# Patient Record
Sex: Female | Born: 1990 | Race: White | Hispanic: No | Marital: Married | Smoking: Never smoker
Health system: Southern US, Community
[De-identification: ages and names within clinical notes are randomized; demographics above are authoritative.]

## PROBLEM LIST (undated history)

## (undated) DIAGNOSIS — G43909 Migraine, unspecified, not intractable, without status migrainosus: Secondary | ICD-10-CM

## (undated) DIAGNOSIS — R87629 Unspecified abnormal cytological findings in specimens from vagina: Secondary | ICD-10-CM

## (undated) DIAGNOSIS — J45909 Unspecified asthma, uncomplicated: Secondary | ICD-10-CM

## (undated) HISTORY — PX: WISDOM TOOTH EXTRACTION: SHX21

## (undated) HISTORY — PX: OTHER SURGICAL HISTORY: SHX169

---

## 2014-06-16 HISTORY — PX: OTHER SURGICAL HISTORY: SHX169

## 2019-06-17 NOTE — L&D Delivery Note (Signed)
Delivery Note At 10:25 AM a viable and healthy female was delivered via Vaginal, Spontaneous (Presentation: OP ).  APGAR: 9, 9; weight  pending   Placenta status: Spontaneous, Intact.  Cord: 3 vessels with the following complications: Loose nuchal cord released over the head before baby delivered.  Cord pH: NA  Anesthesia: Epidural Episiotomy: None Lacerations: Right labial laceration  Suture Repair: 3.0 vicryl rapide Est. Blood Loss (mL):  400   Mom to postpartum.  Baby to Couplet care / Skin to Skin.  Robley Fries 04/26/2020, 10:57 AM

## 2019-10-19 LAB — OB RESULTS CONSOLE RUBELLA ANTIBODY, IGM: Rubella: IMMUNE

## 2019-10-19 LAB — OB RESULTS CONSOLE RPR: RPR: NONREACTIVE

## 2019-10-19 LAB — OB RESULTS CONSOLE GC/CHLAMYDIA
Chlamydia: NEGATIVE
Gonorrhea: NEGATIVE

## 2019-10-19 LAB — OB RESULTS CONSOLE HIV ANTIBODY (ROUTINE TESTING): HIV: NONREACTIVE

## 2019-10-19 LAB — OB RESULTS CONSOLE HEPATITIS B SURFACE ANTIGEN: Hepatitis B Surface Ag: NEGATIVE

## 2019-10-20 ENCOUNTER — Inpatient Hospital Stay (HOSPITAL_COMMUNITY)
Admission: AD | Admit: 2019-10-20 | Payer: PRIVATE HEALTH INSURANCE | Source: Home / Self Care | Admitting: Obstetrics and Gynecology

## 2020-03-21 ENCOUNTER — Encounter (HOSPITAL_COMMUNITY): Payer: Self-pay | Admitting: Obstetrics and Gynecology

## 2020-03-21 ENCOUNTER — Inpatient Hospital Stay (HOSPITAL_COMMUNITY)
Admission: AD | Admit: 2020-03-21 | Discharge: 2020-03-22 | DRG: 833 | Disposition: A | Discharge status: Home / Self Care | Payer: PRIVATE HEALTH INSURANCE | Attending: Obstetrics and Gynecology | Admitting: Obstetrics and Gynecology

## 2020-03-21 ENCOUNTER — Other Ambulatory Visit: Payer: Self-pay

## 2020-03-21 ENCOUNTER — Inpatient Hospital Stay (HOSPITAL_BASED_OUTPATIENT_CLINIC_OR_DEPARTMENT_OTHER): Payer: PRIVATE HEALTH INSURANCE

## 2020-03-21 DIAGNOSIS — Z20822 Contact with and (suspected) exposure to covid-19: Secondary | ICD-10-CM | POA: Diagnosis present

## 2020-03-21 DIAGNOSIS — J45909 Unspecified asthma, uncomplicated: Secondary | ICD-10-CM | POA: Diagnosis present

## 2020-03-21 DIAGNOSIS — Z363 Encounter for antenatal screening for malformations: Secondary | ICD-10-CM | POA: Diagnosis not present

## 2020-03-21 DIAGNOSIS — O99513 Diseases of the respiratory system complicating pregnancy, third trimester: Secondary | ICD-10-CM | POA: Diagnosis present

## 2020-03-21 DIAGNOSIS — Z3A32 32 weeks gestation of pregnancy: Secondary | ICD-10-CM | POA: Diagnosis not present

## 2020-03-21 HISTORY — DX: Unspecified abnormal cytological findings in specimens from vagina: R87.629

## 2020-03-21 HISTORY — DX: Unspecified asthma, uncomplicated: J45.909

## 2020-03-21 LAB — URINALYSIS, ROUTINE W REFLEX MICROSCOPIC
Bilirubin Urine: NEGATIVE
Glucose, UA: NEGATIVE mg/dL
Hgb urine dipstick: NEGATIVE
Ketones, ur: 20 mg/dL — AB
Leukocytes,Ua: NEGATIVE
Nitrite: NEGATIVE
Protein, ur: NEGATIVE mg/dL
Specific Gravity, Urine: 1.005 (ref 1.005–1.030)
pH: 6 (ref 5.0–8.0)

## 2020-03-21 LAB — CBC
HCT: 35.9 % — ABNORMAL LOW (ref 36.0–46.0)
Hemoglobin: 12 g/dL (ref 12.0–15.0)
MCH: 29.5 pg (ref 26.0–34.0)
MCHC: 33.4 g/dL (ref 30.0–36.0)
MCV: 88.2 fL (ref 80.0–100.0)
Platelets: 224 10*3/uL (ref 150–400)
RBC: 4.07 MIL/uL (ref 3.87–5.11)
RDW: 13.6 % (ref 11.5–15.5)
WBC: 15.9 10*3/uL — ABNORMAL HIGH (ref 4.0–10.5)
nRBC: 0 % (ref 0.0–0.2)

## 2020-03-21 LAB — RESPIRATORY PANEL BY RT PCR (FLU A&B, COVID)
Influenza A by PCR: NEGATIVE
Influenza B by PCR: NEGATIVE
SARS Coronavirus 2 by RT PCR: NEGATIVE

## 2020-03-21 LAB — TYPE AND SCREEN
ABO/RH(D): O POS
Antibody Screen: NEGATIVE

## 2020-03-21 IMAGING — US US MFM OB DETAIL+14 WK
1 series · 14 of 28 positions shown · non-contrast
Comparison: none

[Series 1: us mfm ob detail+14 wk · 79 acquisitions, 14 frames shown]
[im 3/79]
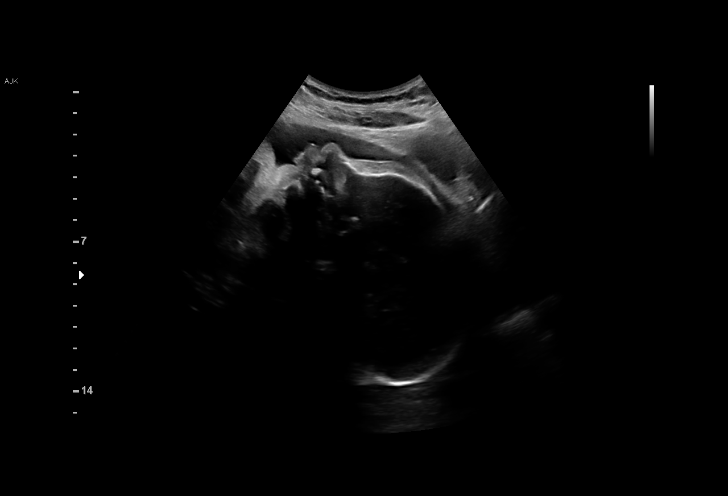
[im 9/79]
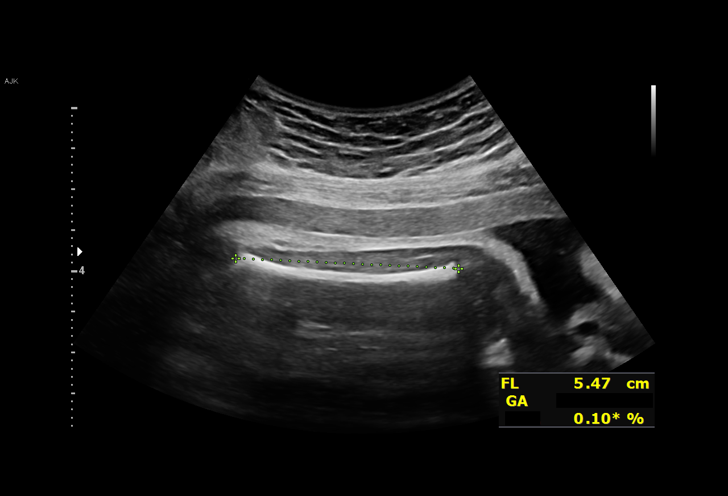
[im 15/79]
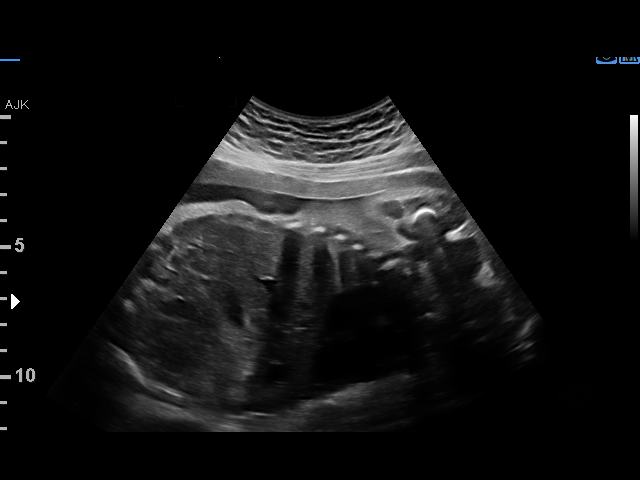
[im 21/79]
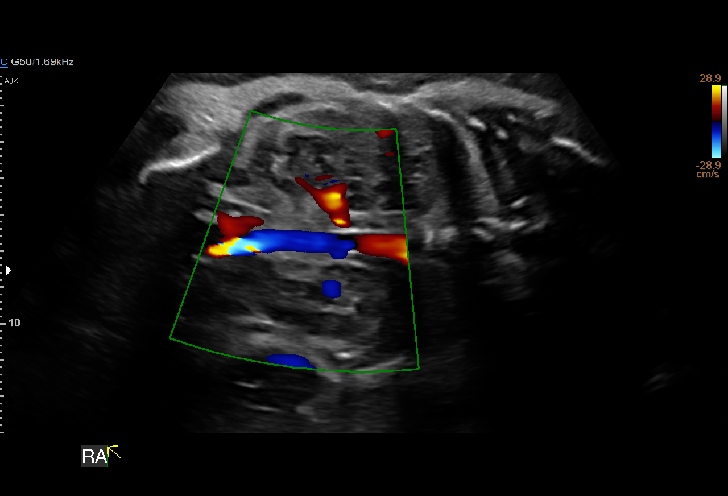
[im 27/79]
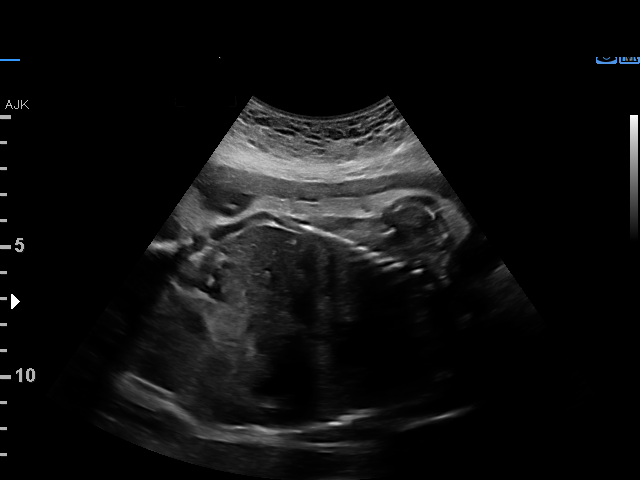
[im 32/79]
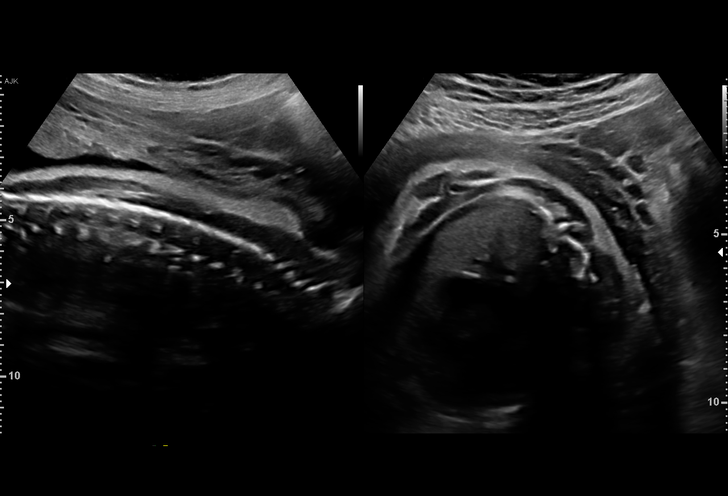
[im 38/79]
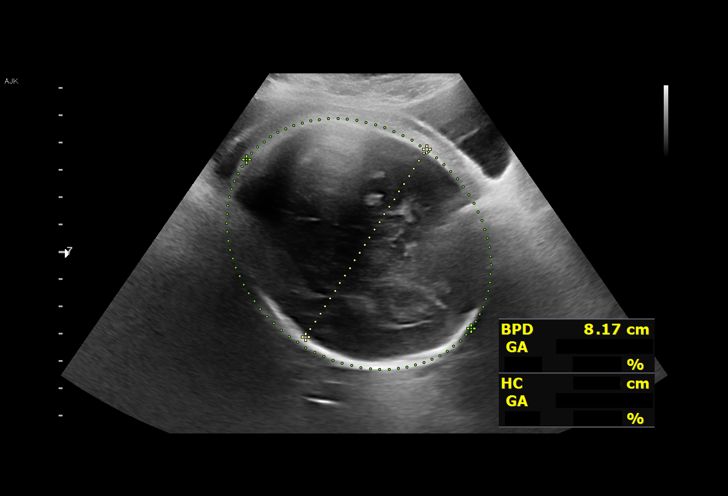
[im 44/79]
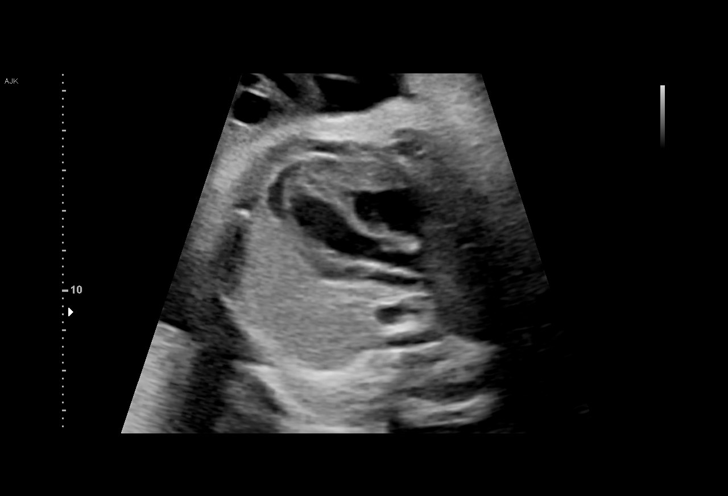
[im 50/79]
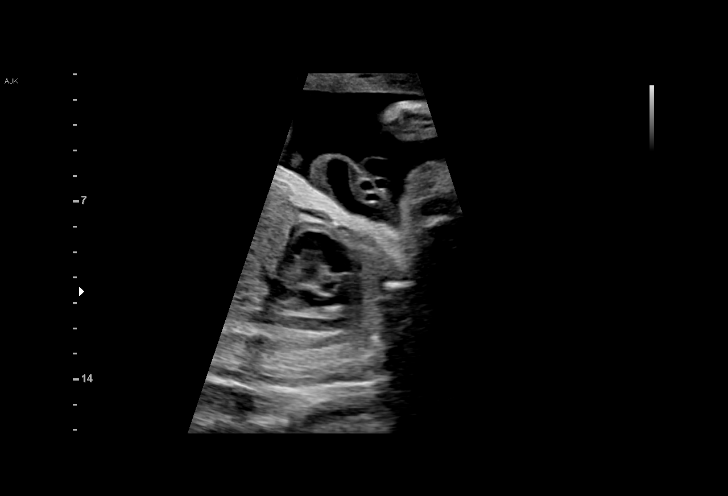
[im 55/79]
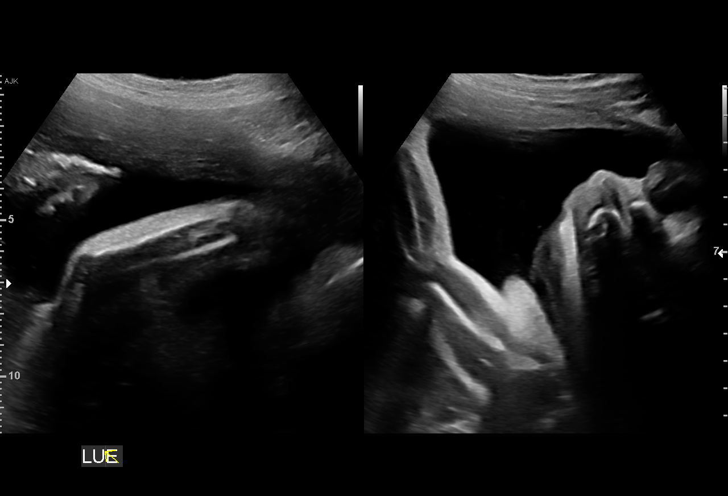
[im 61/79]
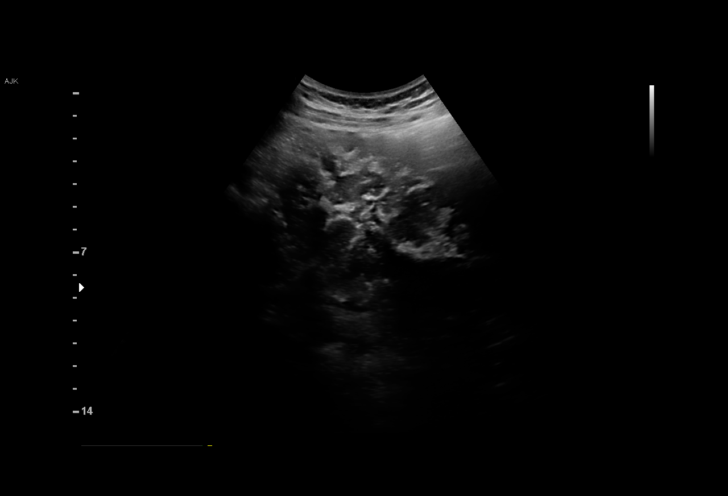
[im 67/79]
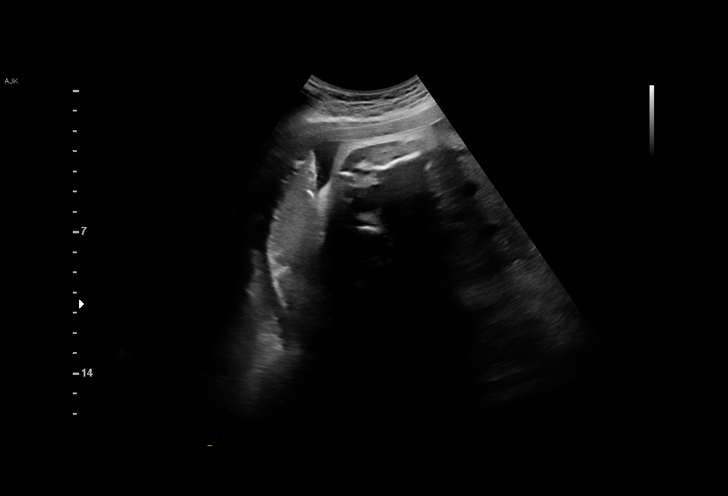
[im 73/79]
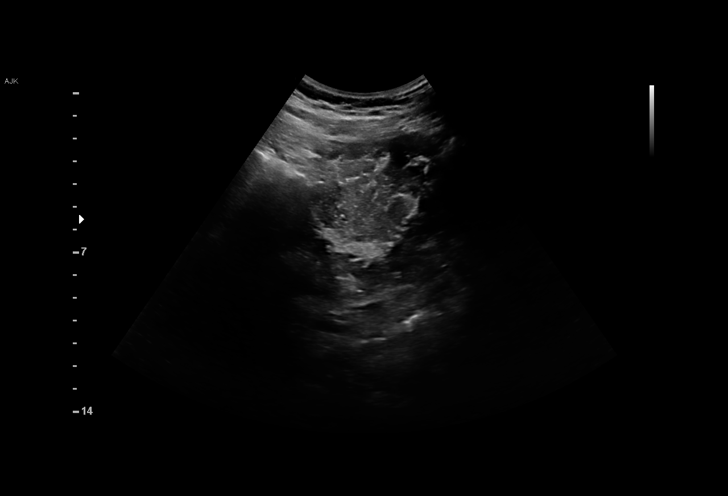
[im 79/79]
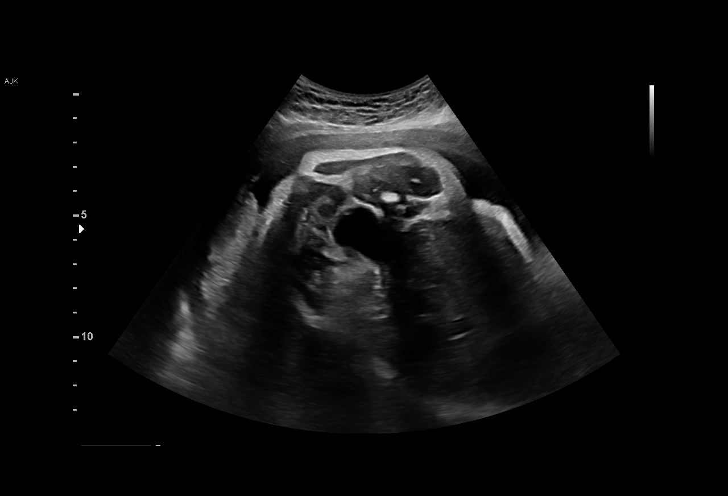

[14 of 28 positions shown; findings below may reference images not displayed]

[ST] [HOSPITAL]

Indications

 32 weeks gestation of pregnancy
 Encounter for antenatal screening for           [ST]
 malformations
 Preterm labor                                   [ST]
Fetal Evaluation

 Num Of Fetuses:          1
 Cardiac Activity:        Observed
 Presentation:            Cephalic
 Placenta:                Posterior
 P. Cord Insertion:       Visualized, central

 Amniotic Fluid
 AFI FV:      Within normal limits

 AFI Sum(cm)     %Tile       Largest Pocket(cm)
 14.1            48

 RUQ(cm)       RLQ(cm)       LUQ(cm)        LLQ(cm)

Biometry

 BPD:      81.8  mm     G. Age:  32w 6d         52  %    CI:          77.1  %    70 - 86
                                                         FL/HC:       18.2  %    19.9 -
 HC:       295   mm     G. Age:  32w 4d         15  %    HC/AC:       0.98       0.96 -
 AC:      299.5  mm     G. Age:  33w 6d         85  %    FL/BPD:      65.6  %    71 - 87
 FL:       53.7  mm     G. Age:  28w 3d        < 1  %    FL/AC:       17.9  %    20 - 24
 HUM:      47.8  mm     G. Age:  28w 0d        < 5  %

 Est. FW:    [ST]   gm     4 lb 3 oz     27  %
OB History

 Gravidity:    1
Gestational Age

 Clinical EDD:  32w 4d                                        EDD:   [DATE]
 U/S Today:     32w 0d                                        EDD:   [DATE]
 Best:          32w 4d     Det. By:  Clinical EDD             EDD:   [DATE]
Anatomy

 Cranium:               Appears normal         LVOT:                   Appears normal
 Cavum:                 Not well visualized    Aortic Arch:            Not well visualized
 Ventricles:            Not well visualized    Ductal Arch:            Appears normal
 Choroid Plexus:        Not well visualized    Diaphragm:              Appears normal
 Cerebellum:            Not well visualized    Stomach:                Appears normal, left
                                                                       sided
 Posterior Fossa:       Not well visualized    Abdomen:                Appears normal
 Nuchal Fold:           Not applicable (>20    Abdominal Wall:         Not well visualized
                        wks GA)
 Face:                  Appears normal         Cord Vessels:           Appears normal (3
                        (orbits and profile)                           vessel cord)
 Lips:                  Appears normal         Kidneys:                Appear normal
 Palate:                Not well visualized    Bladder:                Appears normal
 Thoracic:              Appears normal         Spine:                  Appears normal
 Heart:                 Appears normal         Upper Extremities:      Visualized
                        (4CH, axis, and
                        situs)
 RVOT:                  Appears normal         Lower Extremities:      Visualized

 Other:  Fetus appears to be a male. Nasal bone visualized. Technically
         difficult due to fetal position.
Impression

 Patient is admitted with diagnosis of preterm labor.

 Amniotic fluid is normal and good fetal activity is seen .Fetal
 growth is appropriate for gestational age .Fetal anatomical
 survey appears normal but is limited because of advanced
 gestational age. Cephalic presentation.
                 FOLKEDAL

## 2020-03-21 MED ORDER — DOCUSATE SODIUM 100 MG PO CAPS
100.0000 mg | ORAL_CAPSULE | Freq: Every day | ORAL | Status: DC
  Administered 2020-03-22: 100 mg via ORAL
  Filled 2020-03-21: qty 1

## 2020-03-21 MED ORDER — ACETAMINOPHEN 325 MG PO TABS
650.0000 mg | ORAL_TABLET | ORAL | Status: DC | PRN
  Administered 2020-03-21: 650 mg via ORAL
  Filled 2020-03-21: qty 2

## 2020-03-21 MED ORDER — NIFEDIPINE 10 MG PO CAPS
10.0000 mg | ORAL_CAPSULE | ORAL | Status: DC
  Administered 2020-03-21: 10 mg via ORAL
  Filled 2020-03-21: qty 1

## 2020-03-21 MED ORDER — AZITHROMYCIN 250 MG PO TABS: 1000.0000 mg | ORAL_TABLET | Freq: Once | ORAL | Status: DC

## 2020-03-21 MED ORDER — BETAMETHASONE SOD PHOS & ACET 6 (3-3) MG/ML IJ SUSP
12.0000 mg | INTRAMUSCULAR | Status: AC
  Administered 2020-03-21 – 2020-03-22 (×2): 12 mg via INTRAMUSCULAR
  Filled 2020-03-21: qty 5

## 2020-03-21 MED ORDER — ZOLPIDEM TARTRATE 5 MG PO TABS: 5.0000 mg | ORAL_TABLET | Freq: Every evening | ORAL | Status: DC | PRN

## 2020-03-21 MED ORDER — SODIUM CHLORIDE 0.9 % IV SOLN: 2.0000 g | Freq: Four times a day (QID) | INTRAVENOUS | Status: DC

## 2020-03-21 MED ORDER — BUTORPHANOL TARTRATE 1 MG/ML IJ SOLN
1.0000 mg | Freq: Once | INTRAMUSCULAR | Status: AC
  Administered 2020-03-21: 1 mg via INTRAVENOUS
  Filled 2020-03-21: qty 1

## 2020-03-21 MED ORDER — NIFEDIPINE 10 MG PO CAPS: 10.0000 mg | ORAL_CAPSULE | Freq: Four times a day (QID) | ORAL | Status: DC

## 2020-03-21 MED ORDER — NIFEDIPINE 10 MG PO CAPS
30.0000 mg | ORAL_CAPSULE | Freq: Once | ORAL | Status: AC
  Administered 2020-03-21: 30 mg via ORAL
  Filled 2020-03-21: qty 3

## 2020-03-21 MED ORDER — PRENATAL MULTIVITAMIN CH: 1.0000 | ORAL_TABLET | Freq: Every day | ORAL | Status: DC

## 2020-03-21 MED ORDER — NIFEDIPINE 10 MG PO CAPS
20.0000 mg | ORAL_CAPSULE | ORAL | Status: DC
  Administered 2020-03-22 (×3): 20 mg via ORAL
  Filled 2020-03-21 (×3): qty 2

## 2020-03-21 MED ORDER — SODIUM CHLORIDE 0.9 % IV SOLN: INTRAVENOUS | Status: DC

## 2020-03-21 MED ORDER — AMOXICILLIN 500 MG PO CAPS: 500.0000 mg | ORAL_CAPSULE | Freq: Three times a day (TID) | ORAL | Status: DC

## 2020-03-21 MED ORDER — CALCIUM CARBONATE ANTACID 500 MG PO CHEW: 2.0000 | CHEWABLE_TABLET | ORAL | Status: DC | PRN

## 2020-03-21 MED ORDER — NIFEDIPINE 10 MG PO CAPS
10.0000 mg | ORAL_CAPSULE | Freq: Once | ORAL | Status: AC
  Administered 2020-03-21: 10 mg via ORAL
  Filled 2020-03-21: qty 1

## 2020-03-21 NOTE — H&P (Addendum)
Chloe Garrett is a 29 y.o. G1P0 at [redacted]w[redacted]d gestation presents for complaint of Contractions.  Sent from office for pre-term labor as cervix was noted to be 81m dilated.  She denies lof, vm and does note +FM.  Antepartum course:  persistant vaginal/urethral irritation; asthma - stable on singulair/pulmacort [facility] at [facility] since 10 wks.  See complete pre-natal records  History OB History    Gravida  1   Para      Term      Preterm      AB      Living        SAB      TAB      Ectopic      Multiple      Live Births             Past Medical History:  Diagnosis Date  . Asthma   . Vaginal Pap smear, abnormal    Past Surgical History:  Procedure Laterality Date  . shoulder     right shoulder  . WISDOM TOOTH EXTRACTION     Family History: family history includes Diabetes in her father and mother; Hypertension in her father. Social History:  reports that she has never smoked. She has never used smokeless tobacco. She reports that she does not drink alcohol and does not use drugs.  ROS: See above otherwise negative  Prenatal labs:  ABO, Rh: --/--/O POS (10/06 1726) Antibody: NEG (10/06 1726) Rubella:  immune RPR:   neg HBsAg:   neg HIV:  neg GBS:   not yet done 1 hr Glucola: Normal Genetic screening: Normal Anatomy US: Normal  Physical Exam:   Dilation: 3 Exam by:: S. Laraya Pestka, MD Blood pressure 107/61, pulse 98, temperature 98.4 F (36.9 C), temperature source Oral, resp. rate 18, height 5\' 1"  (1.549 m), weight 70.3 kg, SpO2 98 %. A&O x 3 HEENT: Normal Lungs: CTAB CV: RRR Abdominal: Soft, Non-tender and Gravid Lower Extremities: Non-edematous, Non-tender  Pelvic Exam:      Dilatation: 3cm     Effacement: 30%     Station: -3     Presentation: Cephalic  Labs:  CBC:  Lab Results  Component Value Date   WBC 15.9 (H) 03/21/2020   RBC 4.07 03/21/2020   HGB 12.0 03/21/2020   HCT 35.9 (L) 03/21/2020   MCV 88.2 03/21/2020   MCH 29.5  03/21/2020   MCHC 33.4 03/21/2020   RDW 13.6 03/21/2020   PLT 224 03/21/2020   CMP: No results found for: NA, K, CL, CO2, GLUCOSE, BUN, CREATININE, CALCIUM, PROT, AST, ALT, ALBUMIN, ALKPHOS, BILITOT, GFRNONAA, GFRAA, ANIONGAP Urine: No results found for: COLORURINE, APPEARANCEUR, LABSPEC, PHURINE, GLUCOSEU, HGBUR, BILIRUBINUR, KETONESUR, PROTEINUR, NITRITE, LEUKOCYTESUR   Prenatal Transfer Tool  Maternal Diabetes: No Genetic Screening: Normal Maternal Ultrasounds/Referrals: Normal Fetal Ultrasounds or other Referrals:  None Maternal Substance Abuse:  No Significant Maternal Medications:  Singulair, pulmacort Significant Maternal Lab Results: None  FHT:  TOCO: q 1-2 min; palpate mild, pt rates about a 3/10  Assessment/Plan:  29 y.o. G1P0 at [redacted]w[redacted]d gestation   1. Preterm labor - begin procardia for tocolysis, will begin IVF, u/a pending but pt has not been hydrating much today, get growth u/s; cx not changed in 2 hrs (since exam at office); gbs now; ffn done in office and pending; get growth u/s now; I have reviewed the diagnosis with pt and husband and plan, questions answered; reassured that despite contractions continuing that there has not been further dilation,  contin to monitor closely 2. Prematurity - bmz x1 now, repeat in 24 hrs; consult nicu 3. gbs unknown - plan prophylaxis if active labor, no pcn allergy 4. Fetal status reassuring 5. Rh pos 6. RI  7. Asthma - stable, singulair,pulmacort  **update: FFN negative  U/s: cephalic, afi 14cm, efw 27% 1911g (4'3")   Vick Frees 03/21/2020, 9:02 PM

## 2020-03-21 NOTE — Progress Notes (Signed)
2100:  Called unit to get update on patient; nurse reported that ctx still about q 2 min, pt rating about 4; no vb, lof; pt c/o new shaking;   Strip reviewed Fht: 140s, nml variability, +accels, no decels Toco: q 2-3 min  cx checked by nursing:  3.5/70/-1  1. PTL - not much cervical change in 3 hrs, plan stadol now and monitor closely

## 2020-03-22 MED ORDER — FAMOTIDINE 20 MG PO TABS
40.0000 mg | ORAL_TABLET | Freq: Every day | ORAL | Status: DC | PRN
  Administered 2020-03-22: 40 mg via ORAL
  Filled 2020-03-22: qty 2

## 2020-03-22 MED ORDER — NIFEDIPINE 10 MG PO CAPS
10.0000 mg | ORAL_CAPSULE | ORAL | Status: DC
  Administered 2020-03-22 (×2): 10 mg via ORAL
  Filled 2020-03-22 (×2): qty 1

## 2020-03-22 MED ORDER — BUTALBITAL-APAP-CAFFEINE 50-325-40 MG PO TABS
1.0000 | ORAL_TABLET | Freq: Four times a day (QID) | ORAL | Status: DC | PRN
  Administered 2020-03-22: 1 via ORAL
  Filled 2020-03-22: qty 1

## 2020-03-22 MED ORDER — NIFEDIPINE 10 MG PO CAPS
20.0000 mg | ORAL_CAPSULE | Freq: Four times a day (QID) | ORAL | 1 refills | Status: DC
Start: 2020-03-22 — End: 2020-04-26

## 2020-03-22 NOTE — Discharge Summary (Signed)
Patient ID: Chloe Garrett MRN: [id number] DOB/AGE: Dec 15, 1990 29 y.o.  Admit date: 03/21/2020 Discharge date: March 22, 2020  Admission Diagnoses: Preterm labor [O60.00]  Discharge Diagnoses: Preterm labor [O60.00]  Arrested preterm labor         Discharged Condition: stable  Hospital Course: Patient admitted with preterm contractions and cervical dilation 3 cm, 20% effaced.  Patient was started on Procardia and dose and interval were changed throughout her stay until she achieved relief from her symptomatic contractions.  At time of discharge patient was taking Procardia 10 mg every 3 hours.  Patient denies headache.  Patient reports some back pain from the bed but no contractions and was not feeling the same as she was feeling prior to admission.  Patient reports no leakage of fluid, no vaginal bleeding and good fetal movement.  Repeat cervical exam done prior to discharge with no significant change from her exam by me in the office yesterday.  Cervical exam now 3 cm, 50% effaced, medium consistency, vertex -3, mid position.  Option to spend another night in the hospital or go home after her second betamethasone discussed with patient.  Patient reports she can be compliant with home bedrest and understands the importance of taking the Procardia at home.  Decision was made to send patient home follow-up with new contractions and if symptoms stable follow-up in the office in 1 week.  Consults: None and MFM  Treatments: IV hydration, bedrest, tocolytics, betamethasone  Disposition: home   Allergies as of 03/22/2020      Reactions   Codeine Nausea And Vomiting   Imitrex [sumatriptan] Itching   Latex Itching      Medication List    TAKE these medications   budesonide 1 MG/2ML nebulizer solution Commonly known as: PULMICORT Take 1 mg by nebulization daily.   cetirizine 10 MG tablet Commonly known as: ZYRTEC Take 10 mg by mouth daily.   docusate sodium 100 MG capsule Commonly known  as: COLACE Take 100 mg by mouth 2 (two) times daily.   montelukast 10 MG tablet Commonly known as: SINGULAIR Take 10 mg by mouth at bedtime.   NIFEdipine 10 MG capsule Commonly known as: PROCARDIA Take 2 capsules (20 mg total) by mouth every 6 (six) hours.   polyethylene glycol 17 g packet Commonly known as: MIRALAX / GLYCOLAX Take 17 g by mouth daily.        Signed: Lendon Colonel, MD MD 03/22/2020, 4:17 PM

## 2020-03-22 NOTE — Lactation Note (Signed)
Lactation Consultation Note  Patient Name: Larsen Zettel DEYCX'K Date: 03/22/2020   This is moms first baby.  Mom being d/c today. Mom reports baby boy Joelene Millin will hopefully not come for 3 more weeks.  Mom reports no one in her family has been successful at breastfeeding.   Discussed initiating pumping within 6 hours of d/c.  Ideally within the first hour.   Mom reports she just got her personal use breastpump ordered.  Mom reports she ordered a spectra pump.   Discussed pumping 8-12 times day for 15 minutes.   Discussed adding massage and hand expression to using DEBP. Urged mom to watch [employer].  Urged mom to call lactation as needed with questions/concersn.   Maternal Data    Feeding    LATCH Score                   Interventions    Lactation Tools Discussed/Used     Consult Status      Jaceyon Strole Michaelle Copas 03/22/2020, 5:49 PM

## 2020-03-22 NOTE — Progress Notes (Signed)
Pt c/o headache, worsening overnight - feels d/t not eating much and tension h/a; feeling ctx about q 2-10; states that infrequent and mild after procardia, but that an hr before next dose is due feels them about q 2-35min, breathes through them (also states that she has strong period cramps and breathes through these as well); no vb/lof Didn't really sleep last night, didn't ask for ambien; hard for her brain to shut down  Temp:  [98 F (36.7 C)-98.4 F (36.9 C)] 98 F (36.7 C) (10/07 0807) Pulse Rate:  [95-108] 103 (10/07 0807) Resp:  [16-18] 18 (10/07 0807) BP: (107-128)/(61-81) 115/73 (10/07 0807) SpO2:  [97 %-99 %] 99 % (10/07 0554) Weight:  [70.3 kg] 70.3 kg (10/06 1629)  A&ox3 nml respirations Abd: soft, nt, gravid LE: noe dema  FHT: 130s, nml variability, +accels, no decels, occ variability TOCO: irregular; spaces out to q 10 min, then periods about q 4  A/P: iup at 32.5wga 1. PTL - stable currently with procardia 20mg  po q4, will decrease dose and increase frequency to q 3 to see if this helps better; reviewed again importance of tocolytic to help get steroids on board, questions answered; ok for regular diet 2. Prematurity - 2nd bmz due 1600 today; nicu consult pending 3. Fetal status reassuring; cephalic, 4'3" (1911g), afi wnl 4. gbs pending 5. Tension h/a - fioricet prn 6. Asthma-singular/pulmacort

## 2020-03-23 LAB — CULTURE, BETA STREP (GROUP B ONLY)

## 2020-04-07 ENCOUNTER — Inpatient Hospital Stay (HOSPITAL_COMMUNITY)
Admission: AD | Admit: 2020-04-07 | Discharge: 2020-04-08 | Disposition: A | Discharge status: Home / Self Care | Payer: PRIVATE HEALTH INSURANCE | Attending: Obstetrics | Admitting: Obstetrics

## 2020-04-07 ENCOUNTER — Encounter (HOSPITAL_COMMUNITY): Payer: Self-pay | Admitting: Obstetrics

## 2020-04-07 ENCOUNTER — Other Ambulatory Visit: Payer: Self-pay

## 2020-04-07 DIAGNOSIS — O4703 False labor before 37 completed weeks of gestation, third trimester: Secondary | ICD-10-CM | POA: Insufficient documentation

## 2020-04-07 DIAGNOSIS — Z3A35 35 weeks gestation of pregnancy: Secondary | ICD-10-CM | POA: Insufficient documentation

## 2020-04-07 NOTE — MAU Note (Signed)
Patient reports contractions that were every 2 mins, starting to spread out though.  Denies LOF/VB.  Endorses + FM.  States she is on procardia for prophylaxis of PTL (last dose at 2000).  Last VE on Tueseday- 4/90%.

## 2020-04-08 DIAGNOSIS — Z3A35 35 weeks gestation of pregnancy: Secondary | ICD-10-CM

## 2020-04-08 DIAGNOSIS — O4703 False labor before 37 completed weeks of gestation, third trimester: Secondary | ICD-10-CM

## 2020-04-08 LAB — URINALYSIS, ROUTINE W REFLEX MICROSCOPIC
Bilirubin Urine: NEGATIVE
Glucose, UA: 150 mg/dL — AB
Hgb urine dipstick: NEGATIVE
Ketones, ur: 5 mg/dL — AB
Nitrite: NEGATIVE
Protein, ur: NEGATIVE mg/dL
Specific Gravity, Urine: 1.01 (ref 1.005–1.030)
pH: 5 (ref 5.0–8.0)

## 2020-04-08 MED ORDER — PROMETHAZINE HCL 25 MG/ML IJ SOLN
25.0000 mg | Freq: Once | INTRAMUSCULAR | Status: AC
  Administered 2020-04-08: 25 mg via INTRAMUSCULAR
  Filled 2020-04-08: qty 1

## 2020-04-08 MED ORDER — BUTORPHANOL TARTRATE 1 MG/ML IJ SOLN
1.0000 mg | Freq: Once | INTRAMUSCULAR | Status: AC
  Administered 2020-04-08: 1 mg via INTRAMUSCULAR
  Filled 2020-04-08: qty 1

## 2020-04-08 NOTE — MAU Provider Note (Signed)
S: Ms. Chloe Garrett is a 29 y.o. G1P0 at [redacted]w[redacted]d  who presents to MAU today complaining contractions q 3-5 minutes throughout the day. She denies vaginal bleeding. She denies LOF. She reports normal fetal movement.  Known threatened preterm labor; did receive course of BMZ. She is taking procardia Q3 hours at home.   O: BP 124/86   Pulse (!) 106   Temp 98.6 F (37 C)   Resp 17   Wt 71.7 kg   BMI 29.85 kg/m  GENERAL: Well-developed, well-nourished female in no acute distress.  HEAD: Normocephalic, atraumatic.  CHEST: Normal effort of breathing, regular heart rate ABDOMEN: Soft, nontender, gravid  Cervical exam:  Dilation: 4 Effacement (%): 80 Station: -1 Presentation: Vertex Exam by:: Venia Carbon NP   Fetal Monitoring: Baseline: 130 bpm Variability: Moderate  Accelerations: 15x15 Decelerations: None Contractions: Q3-4  MDM:  Cervix check after 2 hours in MAU, unchanged. Discussed patient with Dr. Ernestina Penna, discussed admission for observation vs. Home. Ok to offer pain medication Stadol- confirmed with pharmacist ok to use given allergy & phenergan given IM prior to DC home. Patient and partner feel ok about going home to rest. Strict return precautions reviewed.   A: SIUP at [redacted]w[redacted]d  False labor- threatened preterm labor.   P: Discharge home in stable condition  Threatened preterm labor Rest F/u with OB next week.   Venia Carbon I, NP 04/08/2020 2:39 AM

## 2020-04-18 LAB — OB RESULTS CONSOLE GBS: GBS: NEGATIVE

## 2020-04-25 ENCOUNTER — Encounter (HOSPITAL_COMMUNITY): Payer: Self-pay | Admitting: Obstetrics & Gynecology

## 2020-04-25 ENCOUNTER — Inpatient Hospital Stay (HOSPITAL_COMMUNITY)
Admission: AD | Admit: 2020-04-25 | Discharge: 2020-04-28 | DRG: 807 | Disposition: A | Discharge status: Home / Self Care | Payer: PRIVATE HEALTH INSURANCE | Attending: Obstetrics & Gynecology | Admitting: Obstetrics & Gynecology

## 2020-04-25 DIAGNOSIS — O134 Gestational [pregnancy-induced] hypertension without significant proteinuria, complicating childbirth: Secondary | ICD-10-CM | POA: Diagnosis not present

## 2020-04-25 DIAGNOSIS — O26893 Other specified pregnancy related conditions, third trimester: Secondary | ICD-10-CM | POA: Diagnosis not present

## 2020-04-25 DIAGNOSIS — O9952 Diseases of the respiratory system complicating childbirth: Secondary | ICD-10-CM | POA: Diagnosis present

## 2020-04-25 DIAGNOSIS — O99893 Other specified diseases and conditions complicating puerperium: Secondary | ICD-10-CM | POA: Diagnosis not present

## 2020-04-25 DIAGNOSIS — O2693 Pregnancy related conditions, unspecified, third trimester: Secondary | ICD-10-CM | POA: Diagnosis not present

## 2020-04-25 DIAGNOSIS — O9902 Anemia complicating childbirth: Secondary | ICD-10-CM | POA: Diagnosis present

## 2020-04-25 DIAGNOSIS — Z3A37 37 weeks gestation of pregnancy: Secondary | ICD-10-CM

## 2020-04-25 DIAGNOSIS — Z20822 Contact with and (suspected) exposure to covid-19: Secondary | ICD-10-CM | POA: Diagnosis present

## 2020-04-25 DIAGNOSIS — O113 Pre-existing hypertension with pre-eclampsia, third trimester: Secondary | ICD-10-CM | POA: Diagnosis not present

## 2020-04-25 DIAGNOSIS — O139 Gestational [pregnancy-induced] hypertension without significant proteinuria, unspecified trimester: Secondary | ICD-10-CM | POA: Diagnosis present

## 2020-04-25 DIAGNOSIS — R519 Headache, unspecified: Secondary | ICD-10-CM | POA: Diagnosis not present

## 2020-04-25 DIAGNOSIS — J45909 Unspecified asthma, uncomplicated: Secondary | ICD-10-CM | POA: Diagnosis present

## 2020-04-25 LAB — COMPREHENSIVE METABOLIC PANEL
ALT: 18 U/L (ref 0–44)
AST: 22 U/L (ref 15–41)
Albumin: 2.7 g/dL — ABNORMAL LOW (ref 3.5–5.0)
Alkaline Phosphatase: 102 U/L (ref 38–126)
Anion gap: 11 (ref 5–15)
BUN: 6 mg/dL (ref 6–20)
CO2: 22 mmol/L (ref 22–32)
Calcium: 8.3 mg/dL — ABNORMAL LOW (ref 8.9–10.3)
Chloride: 102 mmol/L (ref 98–111)
Creatinine, Ser: 0.68 mg/dL (ref 0.44–1.00)
GFR, Estimated: >60 mL/min (ref >60)
Glucose, Bld: 120 mg/dL — ABNORMAL HIGH (ref 70–99)
Potassium: 3.1 mmol/L — ABNORMAL LOW (ref 3.5–5.1)
Sodium: 135 mmol/L (ref 135–145)
Total Bilirubin: 0.6 mg/dL (ref 0.3–1.2)
Total Protein: 6.1 g/dL — ABNORMAL LOW (ref 6.5–8.1)

## 2020-04-25 LAB — CBC
HCT: 33.1 % — ABNORMAL LOW (ref 36.0–46.0)
Hemoglobin: 11.2 g/dL — ABNORMAL LOW (ref 12.0–15.0)
MCH: 28.9 pg (ref 26.0–34.0)
MCHC: 33.8 g/dL (ref 30.0–36.0)
MCV: 85.3 fL (ref 80.0–100.0)
Platelets: 209 10*3/uL (ref 150–400)
RBC: 3.88 MIL/uL (ref 3.87–5.11)
RDW: 14.1 % (ref 11.5–15.5)
WBC: 15.1 10*3/uL — ABNORMAL HIGH (ref 4.0–10.5)
nRBC: 0 % (ref 0.0–0.2)

## 2020-04-25 LAB — URINALYSIS, ROUTINE W REFLEX MICROSCOPIC
Bacteria, UA: NONE SEEN
Bilirubin Urine: NEGATIVE
Glucose, UA: NEGATIVE mg/dL
Hgb urine dipstick: NEGATIVE
Ketones, ur: NEGATIVE mg/dL
Nitrite: NEGATIVE
Protein, ur: NEGATIVE mg/dL
Specific Gravity, Urine: 1.002 — ABNORMAL LOW (ref 1.005–1.030)
pH: 6 (ref 5.0–8.0)

## 2020-04-25 LAB — PROTEIN / CREATININE RATIO, URINE
Creatinine, Urine: 22.16 mg/dL
Total Protein, Urine: <6 mg/dL

## 2020-04-25 NOTE — MAU Note (Signed)
Ctxs since 1600. Denies LOF or VB. 4cm last sve

## 2020-04-25 NOTE — MAU Provider Note (Signed)
Chief Complaint:  Contractions   First Provider Initiated Contact with Patient 04/25/20 2153     HPI: Chloe Garrett is a 29 y.o. G1P0 at 50w4dwho presents to maternity admissions reporting painful uterine contractions.  Noted to have elevated BP on assessment so I was asked to see her.   She reports good fetal movement, denies LOF, vaginal bleeding, vaginal itching/burning, urinary symptoms, dizziness, n/v, diarrhea, constipation or fever/chills.  She denies visual changes or RUQ abdominal pain.  States has had a mild headache for 3 days.  Currently does not note a headache but had one earlier today.   04/08/20:   136/91  Hypertension This is a recurrent problem. The current episode started 1 to 4 weeks ago. Associated symptoms include headaches. Pertinent negatives include no anxiety, blurred vision, chest pain, peripheral edema or shortness of breath. There are no associated agents to hypertension. Risk factors: Pregnancy as risk for HTN. Past treatments include nothing. There are no compliance problems.     Past Medical History: Past Medical History:  Diagnosis Date  . Asthma   . Vaginal Pap smear, abnormal     Past obstetric history: OB History  Gravida Para Term Preterm AB Living  1            SAB TAB Ectopic Multiple Live Births               # Outcome Date GA Lbr Len/2nd Weight Sex Delivery Anes PTL Lv  1 Current             Past Surgical History: Past Surgical History:  Procedure Laterality Date  . shoulder     right shoulder  . WISDOM TOOTH EXTRACTION      Family History: Family History  Problem Relation Age of Onset  . Diabetes Mother   . Diabetes Father   . Hypertension Father     Social History: Social History   Tobacco Use  . Smoking status: Never Smoker  . Smokeless tobacco: Never Used  Vaping Use  . Vaping Use: Never used  Substance Use Topics  . Alcohol use: Never  . Drug use: Never    Allergies:  Allergies  Allergen Reactions  . Codeine  Nausea And Vomiting  . Imitrex [Sumatriptan] Itching  . Latex Itching    Meds:  Medications Prior to Admission  Medication Sig Dispense Refill Last Dose  . acetaminophen (TYLENOL) 500 MG tablet Take 500 mg by mouth once.     . budesonide (PULMICORT) 1 MG/2ML nebulizer solution Take 1 mg by nebulization daily.     . cetirizine (ZYRTEC) 10 MG tablet Take 10 mg by mouth daily.     Marland Kitchen docusate sodium (COLACE) 100 MG capsule Take 100 mg by mouth 2 (two) times daily.     . montelukast (SINGULAIR) 10 MG tablet Take 10 mg by mouth at bedtime.     Marland Kitchen NIFEdipine (PROCARDIA) 10 MG capsule Take 2 capsules (20 mg total) by mouth every 6 (six) hours. (Patient taking differently: Take 20 mg by mouth every 6 (six) hours. Pt instructed to take 10mg  every 3 hours-took 20mg  at 2000) 240 capsule 1   . polyethylene glycol (MIRALAX / GLYCOLAX) 17 g packet Take 17 g by mouth daily.       I have reviewed patient's Past Medical Hx, Surgical Hx, Family Hx, Social Hx, medications and allergies.   ROS:  Review of Systems  Eyes: Negative for blurred vision.  Respiratory: Negative for shortness of breath.   Cardiovascular:  Negative for chest pain.  Neurological: Positive for headaches.   Other systems negative  Physical Exam   Patient Vitals for the past 24 hrs:  BP Temp Pulse Resp Height Weight  04/25/20 2126 133/89 -- 91 -- -- --  04/25/20 2124 -- 98.7 F (37.1 C) -- 18 5\' 1"  (1.549 m) 75.3 kg   Vitals:   04/25/20 2216 04/25/20 2231 04/25/20 2246 04/25/20 2300  BP: 135/90 (!) 130/93 (!) 130/92 133/90  Pulse: 93 90 92 94  Resp:      Temp:      Weight:      Height:        Constitutional: Well-developed, well-nourished female in no acute distress.  Cardiovascular: normal rate and rhythm Respiratory: normal effort, clear to auscultation bilaterally GI: Abd soft, non-tender, gravid appropriate for gestational age.   No rebound or guarding. MS: Extremities nontender, no edema, normal ROM Neurologic:  Alert and oriented x 4. DTRs 3+ with no clonus GU: Neg CVAT.  PELVIC EXAM: Dilation: 4 Effacement (%): 80 Cervical Position: Middle Station: -1 Presentation: Vertex Exam by:: [email or web address] Flippin RN   FHT:  Baseline 135 , moderate variability, accelerations present, no decelerations Contractions: q 2 mins    Labs: Results for orders placed or performed during the hospital encounter of 04/25/20 (from the past 24 hour(s))  Urinalysis, Routine w reflex microscopic Urine, Clean Catch     Status: Abnormal   Collection Time: 04/25/20  9:30 PM  Result Value Ref Range   Color, Urine STRAW (A) YELLOW   APPearance CLEAR CLEAR   Specific Gravity, Urine 1.002 (L) 1.005 - 1.030   pH 6.0 5.0 - 8.0   Glucose, UA NEGATIVE NEGATIVE mg/dL   Hgb urine dipstick NEGATIVE NEGATIVE   Bilirubin Urine NEGATIVE NEGATIVE   Ketones, ur NEGATIVE NEGATIVE mg/dL   Protein, ur NEGATIVE NEGATIVE mg/dL   Nitrite NEGATIVE NEGATIVE   Leukocytes,Ua TRACE (A) NEGATIVE   RBC / HPF 0-5 0 - 5 RBC/hpf   WBC, UA 0-5 0 - 5 WBC/hpf   Bacteria, UA NONE SEEN NONE SEEN   Squamous Epithelial / LPF 0-5 0 - 5   Mucus PRESENT   Protein / creatinine ratio, urine     Status: None   Collection Time: 04/25/20  9:30 PM  Result Value Ref Range   Creatinine, Urine 22.16 mg/dL   Total Protein, Urine <6 mg/dL   Protein Creatinine Ratio        0.00 - 0.15 mg/mg[Cre]  CBC     Status: Abnormal   Collection Time: 04/25/20 10:15 PM  Result Value Ref Range   WBC 15.1 (H) 4.0 - 10.5 K/uL   RBC 3.88 3.87 - 5.11 MIL/uL   Hemoglobin 11.2 (L) 12.0 - 15.0 g/dL   HCT 13/10/21 (L) 36 - 46 %   MCV 85.3 80.0 - 100.0 fL   MCH 28.9 26.0 - 34.0 pg   MCHC 33.8 30.0 - 36.0 g/dL   RDW 93.9 03.0 - 09.2 %   Platelets 209 150 - 400 K/uL   nRBC 0.0 0.0 - 0.2 %  Comprehensive metabolic panel     Status: Abnormal   Collection Time: 04/25/20 10:15 PM  Result Value Ref Range   Sodium 135 135 - 145 mmol/L   Potassium 3.1 (L) 3.5 - 5.1 mmol/L   Chloride 102 98  - 111 mmol/L   CO2 22 22 - 32 mmol/L   Glucose, Bld 120 (H) 70 - 99 mg/dL   BUN 6 6 -  20 mg/dL   Creatinine, Ser 7.09 0.44 - 1.00 mg/dL   Calcium 8.3 (L) 8.9 - 10.3 mg/dL   Total Protein 6.1 (L) 6.5 - 8.1 g/dL   Albumin 2.7 (L) 3.5 - 5.0 g/dL   AST 22 15 - 41 U/L   ALT 18 0 - 44 U/L   Alkaline Phosphatase 102 38 - 126 U/L   Total Bilirubin 0.6 0.3 - 1.2 mg/dL   GFR, Estimated >62 >83 mL/min   Anion gap 11 5 - 15    --/--/O POS (10/06 1726)  Imaging:  No results found.  MAU Course/MDM: I have ordered labs and reviewed results. Labs are normal  BPs have been consistently elevated.  There was one elevated BP on 04/08/20, with a 3 day history of headaches, this may represent Gestational Hypertension or preeclampsia NST reviewed, reactive Consult Dr Juliene Pina with presentation, exam findings and test results.    Assessment: Single IUP at [redacted]w[redacted]d Gestational Hypertension, possible preeclampsia (labs normal but hx headache in past few days (not currently))  Plan: Admit to Labor and Delivery Routine orders Dr Juliene Pina to follow.  Wynelle Bourgeois CNM, MSN Certified Nurse-Midwife 04/25/2020 9:53 PM

## 2020-04-26 ENCOUNTER — Inpatient Hospital Stay (HOSPITAL_COMMUNITY): Payer: PRIVATE HEALTH INSURANCE | Admitting: Anesthesiology

## 2020-04-26 ENCOUNTER — Encounter (HOSPITAL_COMMUNITY): Payer: Self-pay | Admitting: Obstetrics & Gynecology

## 2020-04-26 ENCOUNTER — Other Ambulatory Visit: Payer: Self-pay

## 2020-04-26 DIAGNOSIS — O139 Gestational [pregnancy-induced] hypertension without significant proteinuria, unspecified trimester: Secondary | ICD-10-CM | POA: Diagnosis present

## 2020-04-26 DIAGNOSIS — R519 Headache, unspecified: Secondary | ICD-10-CM | POA: Diagnosis not present

## 2020-04-26 DIAGNOSIS — O26893 Other specified pregnancy related conditions, third trimester: Secondary | ICD-10-CM | POA: Diagnosis present

## 2020-04-26 DIAGNOSIS — O9952 Diseases of the respiratory system complicating childbirth: Secondary | ICD-10-CM | POA: Diagnosis present

## 2020-04-26 DIAGNOSIS — Z3A37 37 weeks gestation of pregnancy: Secondary | ICD-10-CM | POA: Diagnosis not present

## 2020-04-26 DIAGNOSIS — O2693 Pregnancy related conditions, unspecified, third trimester: Secondary | ICD-10-CM | POA: Diagnosis not present

## 2020-04-26 DIAGNOSIS — O134 Gestational [pregnancy-induced] hypertension without significant proteinuria, complicating childbirth: Secondary | ICD-10-CM | POA: Diagnosis present

## 2020-04-26 DIAGNOSIS — O113 Pre-existing hypertension with pre-eclampsia, third trimester: Secondary | ICD-10-CM

## 2020-04-26 DIAGNOSIS — Z20822 Contact with and (suspected) exposure to covid-19: Secondary | ICD-10-CM | POA: Diagnosis present

## 2020-04-26 DIAGNOSIS — J45909 Unspecified asthma, uncomplicated: Secondary | ICD-10-CM | POA: Diagnosis present

## 2020-04-26 DIAGNOSIS — O9902 Anemia complicating childbirth: Secondary | ICD-10-CM | POA: Diagnosis present

## 2020-04-26 DIAGNOSIS — O99893 Other specified diseases and conditions complicating puerperium: Secondary | ICD-10-CM | POA: Diagnosis not present

## 2020-04-26 LAB — TYPE AND SCREEN
ABO/RH(D): O POS
Antibody Screen: NEGATIVE

## 2020-04-26 LAB — RESPIRATORY PANEL BY RT PCR (FLU A&B, COVID)
Influenza A by PCR: NEGATIVE
Influenza B by PCR: NEGATIVE
SARS Coronavirus 2 by RT PCR: NEGATIVE

## 2020-04-26 LAB — RPR: RPR Ser Ql: NONREACTIVE

## 2020-04-26 MED ORDER — BENZOCAINE-MENTHOL 20-0.5 % EX AERO
1.0000 "application " | INHALATION_SPRAY | CUTANEOUS | Status: DC | PRN
  Administered 2020-04-26 – 2020-04-28 (×2): 1 via TOPICAL
  Filled 2020-04-26 (×2): qty 56

## 2020-04-26 MED ORDER — LACTATED RINGERS IV SOLN: 500.0000 mL | INTRAVENOUS | Status: DC | PRN

## 2020-04-26 MED ORDER — PHENYLEPHRINE 40 MCG/ML (10ML) SYRINGE FOR IV PUSH (FOR BLOOD PRESSURE SUPPORT): 80.0000 ug | PREFILLED_SYRINGE | INTRAVENOUS | Status: DC | PRN

## 2020-04-26 MED ORDER — ONDANSETRON HCL 4 MG PO TABS: 4.0000 mg | ORAL_TABLET | ORAL | Status: DC | PRN

## 2020-04-26 MED ORDER — OXYTOCIN-SODIUM CHLORIDE 30-0.9 UT/500ML-% IV SOLN
2.5000 [IU]/h | INTRAVENOUS | Status: DC
  Filled 2020-04-26: qty 500

## 2020-04-26 MED ORDER — SIMETHICONE 80 MG PO CHEW: 80.0000 mg | CHEWABLE_TABLET | ORAL | Status: DC | PRN

## 2020-04-26 MED ORDER — WITCH HAZEL-GLYCERIN EX PADS: 1.0000 "application " | MEDICATED_PAD | CUTANEOUS | Status: DC | PRN

## 2020-04-26 MED ORDER — DIPHENHYDRAMINE HCL 25 MG PO CAPS: 25.0000 mg | ORAL_CAPSULE | Freq: Four times a day (QID) | ORAL | Status: DC | PRN

## 2020-04-26 MED ORDER — EPHEDRINE 5 MG/ML INJ: 10.0000 mg | INTRAVENOUS | Status: DC | PRN

## 2020-04-26 MED ORDER — ONDANSETRON HCL 4 MG/2ML IJ SOLN
4.0000 mg | Freq: Four times a day (QID) | INTRAMUSCULAR | Status: DC | PRN
  Administered 2020-04-26 (×2): 4 mg via INTRAVENOUS
  Filled 2020-04-26 (×2): qty 2

## 2020-04-26 MED ORDER — SODIUM CHLORIDE (PF) 0.9 % IJ SOLN
INTRAMUSCULAR | Status: DC | PRN
  Administered 2020-04-26: 12 mL/h via EPIDURAL

## 2020-04-26 MED ORDER — SOD CITRATE-CITRIC ACID 500-334 MG/5ML PO SOLN: 30.0000 mL | ORAL | Status: DC | PRN

## 2020-04-26 MED ORDER — LIDOCAINE-EPINEPHRINE (PF) 2 %-1:200000 IJ SOLN
INTRAMUSCULAR | Status: DC | PRN
  Administered 2020-04-26: 5 mL via EPIDURAL

## 2020-04-26 MED ORDER — ACETAMINOPHEN 325 MG PO TABS: 650.0000 mg | ORAL_TABLET | ORAL | Status: DC | PRN

## 2020-04-26 MED ORDER — MONTELUKAST SODIUM 10 MG PO TABS
10.0000 mg | ORAL_TABLET | Freq: Every day | ORAL | Status: DC
  Administered 2020-04-26 – 2020-04-27 (×2): 10 mg via ORAL
  Filled 2020-04-26 (×2): qty 1

## 2020-04-26 MED ORDER — DIBUCAINE (PERIANAL) 1 % EX OINT: 1.0000 "application " | TOPICAL_OINTMENT | CUTANEOUS | Status: DC | PRN

## 2020-04-26 MED ORDER — TETANUS-DIPHTH-ACELL PERTUSSIS 5-2.5-18.5 LF-MCG/0.5 IM SUSY: 0.5000 mL | PREFILLED_SYRINGE | Freq: Once | INTRAMUSCULAR | Status: DC

## 2020-04-26 MED ORDER — FENTANYL-BUPIVACAINE-NACL 0.5-0.125-0.9 MG/250ML-% EP SOLN
12.0000 mL/h | EPIDURAL | Status: DC | PRN
  Filled 2020-04-26: qty 250

## 2020-04-26 MED ORDER — LIDOCAINE HCL (PF) 1 % IJ SOLN
30.0000 mL | INTRAMUSCULAR | Status: AC | PRN
  Administered 2020-04-26: 30 mL via SUBCUTANEOUS
  Filled 2020-04-26: qty 30

## 2020-04-26 MED ORDER — ACETAMINOPHEN 500 MG PO TABS
500.0000 mg | ORAL_TABLET | Freq: Once | ORAL | Status: AC
  Administered 2020-04-26: 500 mg via ORAL
  Filled 2020-04-26: qty 1

## 2020-04-26 MED ORDER — OXYTOCIN BOLUS FROM INFUSION
333.0000 mL | Freq: Once | INTRAVENOUS | Status: AC
  Administered 2020-04-26: 333 mL via INTRAVENOUS

## 2020-04-26 MED ORDER — LACTATED RINGERS IV SOLN: 500.0000 mL | Freq: Once | INTRAVENOUS | Status: DC

## 2020-04-26 MED ORDER — PRENATAL MULTIVITAMIN CH
1.0000 | ORAL_TABLET | Freq: Every day | ORAL | Status: DC
  Administered 2020-04-27 – 2020-04-28 (×2): 1 via ORAL
  Filled 2020-04-26 (×2): qty 1

## 2020-04-26 MED ORDER — POLYETHYLENE GLYCOL 3350 17 G PO PACK
17.0000 g | PACK | Freq: Every day | ORAL | Status: DC
  Administered 2020-04-27 – 2020-04-28 (×2): 17 g via ORAL
  Filled 2020-04-26 (×3): qty 1

## 2020-04-26 MED ORDER — DOCUSATE SODIUM 100 MG PO CAPS
100.0000 mg | ORAL_CAPSULE | Freq: Two times a day (BID) | ORAL | Status: DC
  Administered 2020-04-26 – 2020-04-28 (×4): 100 mg via ORAL
  Filled 2020-04-26 (×5): qty 1

## 2020-04-26 MED ORDER — BUDESONIDE 0.25 MG/2ML IN SUSP: 1.0000 mg | Freq: Every day | RESPIRATORY_TRACT | Status: DC

## 2020-04-26 MED ORDER — COCONUT OIL OIL
1.0000 "application " | TOPICAL_OIL | Status: DC | PRN
  Administered 2020-04-26: 1 via TOPICAL

## 2020-04-26 MED ORDER — LORATADINE 10 MG PO TABS
10.0000 mg | ORAL_TABLET | Freq: Every day | ORAL | Status: DC
  Administered 2020-04-26 – 2020-04-27 (×2): 10 mg via ORAL
  Filled 2020-04-26 (×2): qty 1

## 2020-04-26 MED ORDER — ONDANSETRON HCL 4 MG/2ML IJ SOLN: 4.0000 mg | INTRAMUSCULAR | Status: DC | PRN

## 2020-04-26 MED ORDER — SENNOSIDES-DOCUSATE SODIUM 8.6-50 MG PO TABS
2.0000 | ORAL_TABLET | ORAL | Status: DC
  Administered 2020-04-28: 2 via ORAL
  Filled 2020-04-26: qty 2

## 2020-04-26 MED ORDER — IBUPROFEN 600 MG PO TABS
600.0000 mg | ORAL_TABLET | Freq: Four times a day (QID) | ORAL | Status: DC
  Administered 2020-04-26 – 2020-04-28 (×9): 600 mg via ORAL
  Filled 2020-04-26 (×9): qty 1

## 2020-04-26 MED ORDER — LACTATED RINGERS IV SOLN: INTRAVENOUS | Status: DC

## 2020-04-26 MED ORDER — ZOLPIDEM TARTRATE 5 MG PO TABS: 5.0000 mg | ORAL_TABLET | Freq: Every evening | ORAL | Status: DC | PRN

## 2020-04-26 MED ORDER — DIPHENHYDRAMINE HCL 50 MG/ML IJ SOLN: 12.5000 mg | INTRAMUSCULAR | Status: DC | PRN

## 2020-04-26 NOTE — Progress Notes (Signed)
Subjective: Doing well, rectal pressure   Objective: BP 119/87 (BP Location: Right Arm)   Pulse 91   Temp 98.2 F (36.8 C) (Oral)   Resp 17   Ht 5\' 1"  (1.549 m)   Wt 75.3 kg   SpO2 96%   BMI 31.37 kg/m    FHT:  FHR: 130 bpm, variability: moderate,  accelerations:  Present,  decelerations:  Absent UC:   regular, every 3 minutes SVE:   Dilation: Lip/rim Effacement (%): 100 Station: Plus 1 Exam by:: Fulton Merry  OP and 0 stn  Assessment / Plan: Spontaneous labor, progressing normally GHTN- stable, no meds Fetal Wellbeing:  Category I Pain Control:  Epidural  Anticipated MOD: working towards  [phone] 04/26/2020, 7:52 AM

## 2020-04-26 NOTE — Lactation Note (Addendum)
This note was copied from a baby's chart. Lactation Consultation Note  Patient Name: Chloe Garrett [id number] Date: 04/26/2020 Reason for consult: Initial assessment  Mother is a P46, infant is 4hours old .  Mother was given [facility] brochure and basic teaching done.   Reviewed hand expression with mother. Observed large drops of colostrum.    She is active with [facility].  Attempt to latch infant in football position. mother has lots of colostrum. Infant licked and swallowed colostrum from mothers breast . No latch . Mother left doing STS. Lots of teaching on cue base feeding done and cluster feeding.   Mother to continue to cue base feed infant and feed at least 8-12 times or more in 24 hours and advised to allow for cluster feeding infant as needed.   Mother to continue to due STS. Mother is aware of available LC services at [facility], BFSG'S, OP Dept, and phone # for questions or concerns about breastfeeding.  Mother receptive to all teaching and plan of care.     Maternal Data Has patient been taught Hand Expression?: Yes Does the patient have breastfeeding experience prior to this delivery?: No  Feeding    LATCH Score                   Interventions Interventions: Breast feeding basics reviewed  Lactation Tools Discussed/Used WIC Program: Yes   Consult Status      Chloe Garrett 04/26/2020, 2:36 PM

## 2020-04-26 NOTE — Anesthesia Preprocedure Evaluation (Signed)
Anesthesia Evaluation  Patient identified by MRN, date of birth, ID band Patient awake    Reviewed: Allergy & Precautions, NPO status , Patient's Chart, lab work & pertinent test results  Airway Mallampati: II  TM Distance: >3 FB Neck ROM: Full    Dental no notable dental hx.    Pulmonary neg pulmonary ROS,    Pulmonary exam normal breath sounds clear to auscultation       Cardiovascular hypertension (gHTN), Normal cardiovascular exam Rhythm:Regular Rate:Normal     Neuro/Psych negative neurological ROS  negative psych ROS   GI/Hepatic negative GI ROS, Neg liver ROS,   Endo/Other  negative endocrine ROS  Renal/GU negative Renal ROS  negative genitourinary   Musculoskeletal negative musculoskeletal ROS (+)   Abdominal   Peds  Hematology negative hematology ROS (+)   Anesthesia Other Findings Active labor with gHTN  Reproductive/Obstetrics (+) Pregnancy                             Anesthesia Physical Anesthesia Plan  ASA: III  Anesthesia Plan: Epidural   Post-op Pain Management:    Induction:   PONV Risk Score and Plan: Treatment may vary due to age or medical condition  Airway Management Planned: Natural Airway  Additional Equipment:   Intra-op Plan:   Post-operative Plan:   Informed Consent: I have reviewed the patients History and Physical, chart, labs and discussed the procedure including the risks, benefits and alternatives for the proposed anesthesia with the patient or authorized representative who has indicated his/her understanding and acceptance.       Plan Discussed with: Anesthesiologist  Anesthesia Plan Comments: (Patient identified. Risks, benefits, options discussed with patient including but not limited to bleeding, infection, nerve damage, paralysis, failed block, incomplete pain control, headache, blood pressure changes, nausea, vomiting, reactions to  medication, itching, and post partum back pain. Confirmed with bedside nurse the patient's most recent platelet count. Confirmed with the patient that they are not taking any anticoagulation, have any bleeding history or any family history of bleeding disorders. Patient expressed understanding and wishes to proceed. All questions were answered. )        Anesthesia Quick Evaluation

## 2020-04-26 NOTE — Anesthesia Postprocedure Evaluation (Signed)
Anesthesia Post Note  Patient: Chloe Garrett  Procedure(s) Performed: AN AD HOC LABOR EPIDURAL     Patient location during evaluation: Mother Baby Anesthesia Type: Epidural Level of consciousness: awake and alert Pain management: pain level controlled Vital Signs Assessment: post-procedure vital signs reviewed and stable Respiratory status: spontaneous breathing, nonlabored ventilation and respiratory function stable Cardiovascular status: stable Postop Assessment: no headache, no backache and epidural receding Anesthetic complications: no   No complications documented.  Last Vitals:  Vitals:   04/26/20 1227 04/26/20 1327  BP: (!) 130/91 (!) 135/96  Pulse: 90 80  Resp: 18 18  Temp: 36.8 C 36.9 C  SpO2: 98% 100%    Last Pain:  Vitals:   04/26/20 1327  TempSrc: Oral  PainSc: 0-No pain   Pain Goal: Patients Stated Pain Goal: 0 (04/25/20 2126)                 Emmaline Kluver N

## 2020-04-26 NOTE — Anesthesia Procedure Notes (Signed)
Epidural Patient location during procedure: OB Start time: 04/26/2020 2:04 AM End time: 04/26/2020 2:14 AM  Staffing Anesthesiologist: Elmer Picker, MD Performed: anesthesiologist   Preanesthetic Checklist Completed: patient identified, IV checked, risks and benefits discussed, monitors and equipment checked, pre-op evaluation and timeout performed  Epidural Patient position: sitting Prep: DuraPrep and site prepped and draped Patient monitoring: continuous pulse ox, blood pressure, heart rate and cardiac monitor Approach: midline Location: L3-L4 Injection technique: LOR air  Needle:  Needle type: Tuohy  Needle gauge: 17 G Needle length: 9 cm Needle insertion depth: 5 cm Catheter type: closed end flexible Catheter size: 19 Gauge Catheter at skin depth: 10 cm Test dose: negative  Assessment Sensory level: T8 Events: blood not aspirated, injection not painful, no injection resistance, no paresthesia and negative IV test  Additional Notes Patient identified. Risks/Benefits/Options discussed with patient including but not limited to bleeding, infection, nerve damage, paralysis, failed block, incomplete pain control, headache, blood pressure changes, nausea, vomiting, reactions to medication both or allergic, itching and postpartum back pain. Confirmed with bedside nurse the patient's most recent platelet count. Confirmed with patient that they are not currently taking any anticoagulation, have any bleeding history or any family history of bleeding disorders. Patient expressed understanding and wished to proceed. All questions were answered. Sterile technique was used throughout the entire procedure. Please see nursing notes for vital signs. Test dose was given through epidural catheter and negative prior to continuing to dose epidural or start infusion. Warning signs of high block given to the patient including shortness of breath, tingling/numbness in hands, complete motor block,  or any concerning symptoms with instructions to call for help. Patient was given instructions on fall risk and not to get out of bed. All questions and concerns addressed with instructions to call with any issues or inadequate analgesia.  Reason for block:procedure for pain

## 2020-04-26 NOTE — H&P (Signed)
Chloe Garrett is a 29 y.o. female G1, at 37.5 wks, presenting for contractions that were getting worse this evening. In MAU she was noted to have c/o persistent headache and mildly elevated BPs with diagnosis of gestational HTN, and delivery was advised.  PNCare complicated by preterm labor at 65 wksm started Procardia and stopped at 37 wks Urethral pain  Stable Asthma  Growth sono at 28, 32, 36 wks, last at 36 wks, AGA 6'1" Vx, nl AFI Took Covid vaccines, TDAP and FLu vaccine  OB History    Gravida  1   Para      Term      Preterm      AB      Living        SAB      TAB      Ectopic      Multiple      Live Births             Past Medical History:  Diagnosis Date  . Asthma   . Vaginal Pap smear, abnormal    Past Surgical History:  Procedure Laterality Date  . shoulder     right shoulder  . WISDOM TOOTH EXTRACTION     Family History: family history includes Diabetes in her father and mother; Hypertension in her father. Social History:  reports that she has never smoked. She has never used smokeless tobacco. She reports that she does not drink alcohol and does not use drugs.     Maternal Diabetes: No Genetic Screening: Normal  NIPS, XY Maternal Ultrasounds/Referrals: Normal Fetal Ultrasounds or other Referrals:  None Maternal Substance Abuse:  No Significant Maternal Medications:  Meds include: Other: Simbicort, Pulmicort, Albuterol, Procardia for preterm labor from 32 wks  Significant Maternal Lab Results:  Group B Strep negative Other Comments:  None  Review of Systems History Dilation: 5 Effacement (%): 100 Station: -1 Exam by:: Dr. Juliene Pina Blood pressure 133/87, pulse 98, temperature 98.5 F (36.9 C), temperature source Oral, resp. rate 17, height 5\' 1"  (1.549 m), weight 75.3 kg. Exam Physical Exam  A&O x 3, no acute distress. Pleasant HEENT neg, no thyromegaly Lungs CTA bilat CV RRR, S1S2 normal Abdo soft, non tender, non acute Extr no edema/  tenderness Pelvic see above, AROM, clear fluid  FHT  140s + accels no decels mod variability- cat I Toco q 3-4 min, spontaneous   Prenatal labs: ABO, Rh: --/--/O POS (11/10 2216) Antibody: NEG (11/10 2216) Rubella: Immune (05/05 0000) RPR: Nonreactive (05/05 0000)  HBsAg: Negative (05/05 0000)  HIV: Non-reactive (05/05 0000)  GBS:   Negative NIPS low risk, XY AFP 1 nl   Assessment/Plan: 29 yo G1 at 37.5 wks, active labor, gestational HTN, mild range BPs Admit, expectant mngmt, watch BPs, labs  AROM, EFW 7 lbs.    37 04/26/2020, 1:43 AM

## 2020-04-26 NOTE — Plan of Care (Signed)
  Problem: Education: Goal: Ability to make informed decisions regarding treatment and plan of care will improve Outcome: Progressing Goal: Ability to state and carry out methods to decrease the pain will improve Outcome: Progressing   Problem: Education: Goal: Knowledge of General Education information will improve Description: Including pain rating scale, medication(s)/side effects and non-pharmacologic comfort measures Outcome: Progressing

## 2020-04-26 NOTE — Progress Notes (Addendum)
Subjective: Doing well, rectal pressure and some vag pressure, but intermittent  Objective: BP 121/77   Pulse 89   Temp 98 F (36.7 C) (Oral)   Resp 17   Ht 5\' 1"  (1.549 m)   Wt 75.3 kg   SpO2 96%   BMI 31.37 kg/m    FHT:  FHR: 130 bpm, variability: moderate,  accelerations:  Present,  decelerations:  Absent UC:   regular, every 3 minutes SVE:   Dilation: 10 Effacement (%): 100 Station: 0 Exam by:: Johnita Palleschi  LOT, 0 stn  Assessment / Plan: Spontaneous labor, progressing normally GHTN- stable, no meds Fetal Wellbeing:  Category I Pain Control:  Epidural Borderline pelvis, persistent OT/OP, continue to rotate with peanut ball Anticipated MOD: working towards  [phone] 04/26/2020, 9:02 AM

## 2020-04-27 DIAGNOSIS — R519 Headache, unspecified: Secondary | ICD-10-CM

## 2020-04-27 DIAGNOSIS — O9902 Anemia complicating childbirth: Secondary | ICD-10-CM

## 2020-04-27 LAB — CBC
HCT: 27.1 % — ABNORMAL LOW (ref 36.0–46.0)
Hemoglobin: 9.1 g/dL — ABNORMAL LOW (ref 12.0–15.0)
MCH: 29.4 pg (ref 26.0–34.0)
MCHC: 33.6 g/dL (ref 30.0–36.0)
MCV: 87.4 fL (ref 80.0–100.0)
Platelets: 179 10*3/uL (ref 150–400)
RBC: 3.1 MIL/uL — ABNORMAL LOW (ref 3.87–5.11)
RDW: 14.1 % (ref 11.5–15.5)
WBC: 13.1 10*3/uL — ABNORMAL HIGH (ref 4.0–10.5)
nRBC: 0 % (ref 0.0–0.2)

## 2020-04-27 MED ORDER — POLYSACCHARIDE IRON COMPLEX 150 MG PO CAPS
150.0000 mg | ORAL_CAPSULE | Freq: Every day | ORAL | Status: DC
  Administered 2020-04-27 – 2020-04-28 (×2): 150 mg via ORAL
  Filled 2020-04-27 (×2): qty 1

## 2020-04-27 MED ORDER — MAGNESIUM OXIDE 400 (241.3 MG) MG PO TABS
400.0000 mg | ORAL_TABLET | Freq: Every day | ORAL | Status: DC
  Administered 2020-04-27 – 2020-04-28 (×2): 400 mg via ORAL
  Filled 2020-04-27 (×2): qty 1

## 2020-04-27 MED ORDER — BUTALBITAL-APAP-CAFFEINE 50-325-40 MG PO TABS
2.0000 | ORAL_TABLET | Freq: Four times a day (QID) | ORAL | Status: DC | PRN
  Administered 2020-04-27 – 2020-04-28 (×2): 2 via ORAL
  Filled 2020-04-27 (×2): qty 2

## 2020-04-27 MED ORDER — NIFEDIPINE ER OSMOTIC RELEASE 30 MG PO TB24
30.0000 mg | ORAL_TABLET | Freq: Every day | ORAL | Status: DC
  Administered 2020-04-27 – 2020-04-28 (×2): 30 mg via ORAL
  Filled 2020-04-27 (×2): qty 1

## 2020-04-27 NOTE — Progress Notes (Signed)
PPD # 1 S/P NSVD  Live born female  Birth Weight: 6 lb 12.5 oz (3076 g) APGAR: 9, 9  Newborn Delivery   Birth date/time: 04/26/2020 10:25:00 Delivery type: Vaginal, Spontaneous     Baby name: Joelene Millin Delivering provider: MODY, VAISHALI   Episiotomy:None   Lacerations:Labial   Circumcision Yes, planning  Feeding: breast and bottle  Pain control at delivery: Epidural   Subjective   Reports feeling good. Bleeding is minimal. Has a mild headache unrelieved by Tylenol. States Fioricet has worked in the past. Breastfeeding challenging as baby cannot latch. Feeding formula through a bottle and awaiting lactation assistance.              Tolerating po/ No nausea or vomiting             Bleeding is moderate             Pain controlled with acetaminophen and ibuprofen (OTC)             Up ad lib / ambulatory / voiding without difficulties   Objective   A & O x 3, in no apparent distress              VS:  Vitals:   04/26/20 2100 04/27/20 0146 04/27/20 0549 04/27/20 0844  BP: (!) 136/98 116/84 (!) 122/93   Pulse: 79 79 85   Resp:      Temp: 98.1 F (36.7 C) 97.7 F (36.5 C) 97.9 F (36.6 C)   TempSrc:      SpO2:      Weight:    70.9 kg  Height:        LABS:  Recent Labs    04/25/20 2215 04/27/20 0526  WBC 15.1* 13.1*  HGB 11.2* 9.1*  HCT 33.1* 27.1*  PLT 209 179     Blood type: --/--/O POS (11/10 2216)  Rubella: Immune (05/05 0000)   Vaccines:   TDaP   UTD                   Flu       UTD                             COVID-19 UTD   Gen: AAO x 3, NAD  Abdomen: soft, non-tender, non-distended             Fundus: firm, non-tender, U-1  Perineum: repair intact, no swelling  Lochia: moderate  Extremities: no edema, no calf pain or tenderness   Assessment/Plan PPD # 1 29 y.o., G1P1001   Principal Problem:   Postpartum care following vaginal delivery (11/11)  Doing well - stable status  Routine post partum orders Active Problems:   Gestational  hypertension  WNL PEC labs  Mild headache, will treat with Fioricet  BPs 120-130/80-90s  Will continue to monitor BP and start Procardia if BP warrants   SVD (spontaneous vaginal delivery)   Obstetrical laceration: right labial   Discussed perineal care and comfort measures   Maternal anemia, with delivery  Niferex and mag oxide PO   Headache  Fioricet for mild headache  Anticipate discharge tomorrow.   June Leap, MSN, CNM 04/27/2020, 12:52 PM

## 2020-04-27 NOTE — Lactation Note (Signed)
This note was copied from a baby's chart. Lactation Consultation Note  Patient Name: Boy Shetara Launer RSWNI'O Date: 04/27/2020 Reason for consult: Follow-up assessment  Follow-up visit to 28 hours infant, per mother's request. Father is holding infant upon arrival. Mother requests assistance with latch. Undressed infant and set up support pillows for football position to left breast. Colostrum is easily expressed, demonstrated NS placement and instilled colostrum with curved tip syringe. Latched infant after a few attempts. Mother verbalized pain. Unlatched and reattempted. Mother states better latch. Infant able to transfer colostrum inside shield. Refilled NS with colostrum and relatched. Infant able to transfer, continued suckling and swallowing with a good rhythm. Infant fell asleep at breast. Mother burped. Unable to re-latch. Spoon-fed ~43mL of EBM.   Parents have formula ready to supplement infant but infant seems uninterested.  Plan: 1-Breastfeeding on demand, ensuring a deep, comfortable latch using NS 2-Undressing infant and place skin to skin when ready to breastfeed 3-Keep infant awake during breastfeeding session: massaging breast, infant's hand/shoulder/feet 4-Supplement following guidelines, upright position and pace-feeding 5-Monitor voids and stools as signs good intake.  6-Contact LC as needed for feeds/support/concerns/questions   Maternal Data Has patient been taught Hand Expression?: Yes Does the patient have breastfeeding experience prior to this delivery?: No  Feeding Feeding Type: Breast Fed Nipple Type: Extra Slow Flow  LATCH Score Latch: Repeated attempts needed to sustain latch, nipple held in mouth throughout feeding, stimulation needed to elicit sucking reflex.  Audible Swallowing: A few with stimulation  Type of Nipple: Everted at rest and after stimulation (short nipples)  Comfort (Breast/Nipple): Filling, red/small blisters or bruises, mild/mod  discomfort  Hold (Positioning): Assistance needed to correctly position infant at breast and maintain latch.  LATCH Score: 6  Interventions Interventions: Breast feeding basics reviewed;Assisted with latch;Skin to skin;Breast massage;Hand express;Adjust position;DEBP;Expressed milk;Position options;Support pillows  Lactation Tools Discussed/Used Tools: Pump;Coconut oil;Nipple Dorris Carnes;Bottle Nipple shield size: 16 Flange Size: 21 Breast pump type: Double-Electric Breast Pump Pump Review: Setup, frequency, and cleaning;Milk Storage Initiated by:: Jordell Outten IBCLC Date initiated:: 04/27/20   Consult Status Consult Status: Follow-up Date: 04/28/20 Follow-up type: In-patient    Djuan Talton [name] 04/27/2020, 3:14 PM

## 2020-04-27 NOTE — Lactation Note (Signed)
This note was copied from a baby's chart. Lactation Consultation Note  Patient Name: Chloe Garrett UUEKC'M Date: 04/27/2020 Reason for consult: Follow-up assessment  Follow up to 26 hours old infant of P1 mother. Mother states infant has difficulty latching and she started using formula to feed infant. Mother shares she wanted to use donor milk instead of formula but there was none available at 3 am.   Mother explains infant holds nipple in mouth but no suckling. Infant had one successful latch with NS and formula inside but as soon as infant sucked all formula he stopped suckling. Upon visit parents share infant finished bottlefeeding and took ~4mL of formula.   Discussed hand expression and able to express colostrum with ease. Mother expresses interest to pump with DEBP. Demonstrated how to use DEBP to stimulate breasts. Reviewed milk storage and proper care of parts.  Promoted maternal rest, hydration and food intake. Encouraged to contact [facility] for support when ready to breastfeed baby and recommended to request help for questions or concerns.    All questions answered at this time.    Maternal Data Has patient been taught Hand Expression?: Yes Does the patient have breastfeeding experience prior to this delivery?: No  Feeding Feeding Type: Bottle Fed - Formula Nipple Type: Extra Slow Flow  Interventions Interventions: Breast feeding basics reviewed;Breast massage;Skin to skin;Hand express;DEBP;Hand pump;Expressed milk  Lactation Tools Discussed/Used Tools: Bottle;Pump;Flanges Flange Size: 21 Breast pump type: Double-Electric Breast Pump Pump Review: Setup, frequency, and cleaning;Milk Storage Initiated by:: Chloe Garrett IBCLC Date initiated:: 04/27/20   Consult Status Consult Status: Follow-up Date: 04/27/20 Follow-up type: In-patient    Chloe Garrett [name] 04/27/2020, 12:37 PM

## 2020-04-28 MED ORDER — NIFEDIPINE ER 30 MG PO TB24
30.0000 mg | ORAL_TABLET | Freq: Every day | ORAL | 1 refills | Status: DC
Start: 2020-04-29 — End: 2020-04-28

## 2020-04-28 MED ORDER — NIFEDIPINE ER 30 MG PO TB24
30.0000 mg | ORAL_TABLET | Freq: Every day | ORAL | 1 refills | Status: DC
Start: 2020-04-29 — End: 2022-07-23

## 2020-04-28 NOTE — Discharge Summary (Signed)
OB Discharge Summary  Patient Name: Chloe Garrett DOB: 1991-05-08 MRN: [id number]  Date of admission: 04/25/2020 Delivering provider: MODY, VAISHALI   Admitting diagnosis: Gestational hypertension [O13.9] SVD (spontaneous vaginal delivery) [O80] Intrauterine pregnancy: [redacted]w[redacted]d     Secondary diagnosis: Patient Active Problem List   Diagnosis Date Noted   Maternal anemia, with delivery 04/27/2020   Headache 04/27/2020   Gestational hypertension 04/26/2020   SVD (spontaneous vaginal delivery) 04/26/2020   Postpartum care following vaginal delivery (11/11) 04/26/2020   Obstetrical laceration: right labial  04/26/2020   Preterm labor 03/21/2020   Additional problems:na   Date of discharge: 04/28/2020   Discharge diagnosis: Principal Problem:   Postpartum care following vaginal delivery (11/11) Active Problems:   Gestational hypertension   SVD (spontaneous vaginal delivery)   Obstetrical laceration: right labial    Maternal anemia, with delivery   Headache                                                              Post partum procedures:na  Augmentation: AROM and Pitocin Pain control: Epidural  Laceration:Labial  Episiotomy:None  Complications: None  Hospital course:  Onset of Labor With Vaginal Delivery      29 y.o. yo G1P1001 at [redacted]w[redacted]d was admitted in Latent Labor on 04/25/2020. Patient had an uncomplicated labor course as follows:  Membrane Rupture Time/Date: 1:25 AM ,04/26/2020   Delivery Method:Vaginal, Spontaneous  Episiotomy: None  Lacerations:  Labial  Patient had an uncomplicated postpartum course.  She is ambulating, tolerating a regular diet, passing flatus, and urinating well. Patient is discharged home in stable condition on 04/28/20.  Newborn Data: Birth date:04/26/2020  Birth time:10:25 AM  Gender:Female  Living status:Living  Apgars:9 ,9  Weight:3076 g   Physical exam  Vitals:   04/27/20 0844 04/27/20 1635 04/27/20 2001 04/28/20 0555  BP:  (!)  130/96 (!) 149/97 (!) 126/91  Pulse:  73 70 79  Resp:  16 18   Temp:  98.8 F (37.1 C)    TempSrc:      SpO2:      Weight: 70.9 kg     Height:       General: alert, cooperative and no distress Lochia: appropriate Uterine Fundus: firm Incision: Healing well with no significant drainage Perineum: repair na, tr edema DVT Evaluation: No evidence of DVT seen on physical exam. Negative Homan's sign. Labs: Lab Results  Component Value Date   WBC 13.1 (H) 04/27/2020   HGB 9.1 (L) 04/27/2020   HCT 27.1 (L) 04/27/2020   MCV 87.4 04/27/2020   PLT 179 04/27/2020   CMP Latest Ref Rng & Units 04/25/2020  Glucose 70 - 99 mg/dL 016(W)  BUN 6 - 20 mg/dL 6  Creatinine 1.09 - 3.23 mg/dL 5.57  Sodium 322 - 025 mmol/L 135  Potassium 3.5 - 5.1 mmol/L 3.1(L)  Chloride 98 - 111 mmol/L 102  CO2 22 - 32 mmol/L 22  Calcium 8.9 - 10.3 mg/dL 8.3(L)  Total Protein 6.5 - 8.1 g/dL 6.1(L)  Total Bilirubin 0.3 - 1.2 mg/dL 0.6  Alkaline Phos 38 - 126 U/L 102  AST 15 - 41 U/L 22  ALT 0 - 44 U/L 18   Edinburgh Postnatal Depression Scale Screening Tool 04/27/2020  I have been able to laugh and see the funny side  of things. 0  I have looked forward with enjoyment to things. 0  I have blamed myself unnecessarily when things went wrong. 1  I have been anxious or worried for no good reason. 0  I have felt scared or panicky for no good reason. 1  Things have been getting on top of me. 0  I have been so unhappy that I have had difficulty sleeping. 0  I have felt sad or miserable. 0  I have been so unhappy that I have been crying. 0  The thought of harming myself has occurred to me. 0  Edinburgh Postnatal Depression Scale Total 2   Vaccines: TDaP          utd          Flu             utd                    COVID-19 utd  Discharge instruction:  per After Visit Summary,  [facility] booklet and  "Understanding Mother & Baby Care" hospital booklet  After Visit Meds:  Allergies as of 04/28/2020       Reactions   Codeine Nausea And Vomiting   Latex Itching   Sumatriptan Itching, Other (See Comments)   "all side effects listed with this medication"      Medication List    STOP taking these medications   acetaminophen 500 MG tablet Commonly known as: TYLENOL   budesonide 1 MG/2ML nebulizer solution Commonly known as: PULMICORT   cetirizine 10 MG tablet Commonly known as: ZYRTEC   docusate sodium 100 MG capsule Commonly known as: COLACE   montelukast 10 MG tablet Commonly known as: SINGULAIR   polyethylene glycol 17 g packet Commonly known as: MIRALAX / GLYCOLAX     TAKE these medications   NIFEdipine 30 MG 24 hr tablet Commonly known as: ADALAT CC Take 1 tablet (30 mg total) by mouth daily. Start taking on: April 29, 2020       Diet: routine diet  Activity: Advance as tolerated. Pelvic rest for 6 weeks.   Postpartum contraception: Not Discussed  Newborn Data: Live born female  Birth Weight: 6 lb 12.5 oz (3076 g) APGAR: 9, 9  Newborn Delivery   Birth date/time: 04/26/2020 10:25:00 Delivery type: Vaginal, Spontaneous      named na Baby Feeding: Breast Disposition:home   Delivery Report:  Review the Delivery Report for details.    Follow up:     Signed: Milbert Coulter, MSN 04/28/2020, 11:42 AM

## 2020-04-28 NOTE — Progress Notes (Signed)
PPD # 2 S/P NSVD  Live born female  Birth Weight: 6 lb 12.5 oz (3076 g) APGAR: 9, 9  Newborn Delivery   Birth date/time: 04/26/2020 10:25:00 Delivery type: Vaginal, Spontaneous     Baby name: Joelene Millin Delivering provider: MODY, VAISHALI   Episiotomy:None   Lacerations:Labial   Circumcision Yes, planning  Feeding: breast and bottle  Pain control at delivery: Epidural   Subjective   Reports feeling good. Bleeding is minimal. Has a mild headache unrelieved by Tylenol. States Fioricet has worked in the past. Breastfeeding challenging as baby cannot latch. Feeding formula through a bottle and awaiting lactation assistance.              Tolerating po/ No nausea or vomiting             Bleeding is moderate             Pain controlled with acetaminophen and ibuprofen (OTC)             Up ad lib / ambulatory / voiding without difficulties   Objective   A & O x 3, in no apparent distress              VS:  Vitals:   04/27/20 0844 04/27/20 1635 04/27/20 2001 04/28/20 0555  BP:  (!) 130/96 (!) 149/97 (!) 126/91  Pulse:  73 70 79  Resp:  16 18   Temp:  98.8 F (37.1 C)    TempSrc:      SpO2:      Weight: 70.9 kg     Height:        LABS:  Recent Labs    04/25/20 2215 04/27/20 0526  WBC 15.1* 13.1*  HGB 11.2* 9.1*  HCT 33.1* 27.1*  PLT 209 179     Blood type: --/--/O POS (11/10 2216)  Rubella: Immune (05/05 0000)   Vaccines:   TDaP   UTD                   Flu       UTD                             COVID-19 UTD   Gen: AAO x 3, NAD  Abdomen: soft, non-tender, non-distended             Fundus: firm, non-tender, U-1  Perineum: repair intact, no swelling  Lochia: moderate  Extremities: no edema, no calf pain or tenderness   Assessment/Plan PPD # 2 29 y.o., G1P1001   Principal Problem:   Postpartum care following vaginal delivery (11/11)  Doing well - stable status  Routine post partum orders Active Problems:   Gestational hypertension  WNL PEC labs  Mild  headache, will treat with Fioricet  BPs 120-130/80-90s  Will continue to monitor and continue procardia   SVD (spontaneous vaginal delivery)   Obstetrical laceration: right labial   Discussed perineal care and comfort measures   Maternal anemia, with delivery  Niferex and mag oxide PO   Headache  Fioricet for mild headache  Anticipate discharge . SHort term office fu for bp PEC precautions   Lenoard Aden, MD 04/28/2020, 11:31 AM

## 2020-04-28 NOTE — Lactation Note (Signed)
This note was copied from a baby's chart. Lactation Consultation Note  Patient Name: Chloe Garrett Zettel ZOXWR'U Date: 04/28/2020 Reason for consult: Follow-up assessment;Primapara;1st time breastfeeding;Early term 37-38.6wks;Infant weight loss;Difficult latch  Baby is 48 hours old, 5 % weight loss, for D/C today, and for circ.  Baby awake and hungry, see LC doc flow sheets for details of the latches.  Mom mentioned her nipples are sore and when she is feeding feels intermittent soreness.  LC assessed breast tissue with moms permission - no breakdown noted,  Just short shaft nipples and areola edema.  LC recommended prior to every feeding  Breast massage, hand express, pre-pump to prime the milk ducts to stretch the nipple / areola complex.  LC checked the sizing, and after pre- pumping the #24 NS fit well on the right  And instilled formula and baby latched well with depth and mom was comfortable until the baby sucked down the formula. Baby did well sitting up in 45 degree angle.  Mom wanted to try to latch on the left due to her flow of colostrum and the #2O NS and mom seems to have more discomfort. And after baby finished 2 small intact blisters noted. Baby fed about 8 mins.  The [facility] came to transport baby for a circ.  LC finished the [facility] consult - reviewed sore nipple and engorgement prevention and tx.  LC instructed mom on the use of comfort gels after feedings x 6 days and breast alternating with breast shells while awake.  Storage of breast milk reviewed. LC showed mom how to use the hand pump and checked the #20 flange and #24 flange. Per mom the #24 flange was more comfortable with the expressing stroke of the hand pump .  LC recommended feeding the baby 8-12 times a day  Breast feed with the NS for 15 -20 mins ( 30 mins max and supplement with 30 ml with the bottle and post pump both breast .  Next feeding switch to the other breast.  Per mom has DEBP - Spectra at home.   LC  recommended and offered to request and LC OP appt and mom receptive for next week.  LC placed a request in Epic .      Maternal Data Has patient been taught Hand Expression?: Yes  Feeding Feeding Type: Donor Milk with Formula (right breast increased NS to #24  and baby latch well)  LATCH Score Latch: Grasps breast easily, tongue down, lips flanged, rhythmical sucking.  Audible Swallowing: Spontaneous and intermittent (easily hand express after feeding)  Type of Nipple: Everted at rest and after stimulation  Comfort (Breast/Nipple): Soft / non-tender  Hold (Positioning): Assistance needed to correctly position infant at breast and maintain latch.  LATCH Score: 9  Interventions Interventions: Breast feeding basics reviewed;Assisted with latch;Skin to skin;Breast massage;Hand express;Breast compression;Adjust position;Support pillows;Position options  Lactation Tools Discussed/Used Tools: Shells;Pump;Flanges;Coconut oil;Comfort gels;Nipple Shields Nipple shield size: 24 Flange Size: 21;24 Shell Type: Inverted Breast pump type: Manual;Double-Electric Breast Pump Pump Review: Setup, frequency, and cleaning;Milk Storage (hand pump)   Consult Status Consult Status: Follow-up Date: 04/28/20 Follow-up type: Out-patient    Matilde Sprang Keevin Panebianco 04/28/2020, 11:26 AM

## 2022-01-21 LAB — OB RESULTS CONSOLE GC/CHLAMYDIA
Chlamydia: NEGATIVE
Neisseria Gonorrhea: NEGATIVE

## 2022-01-21 LAB — OB RESULTS CONSOLE RUBELLA ANTIBODY, IGM: Rubella: IMMUNE

## 2022-01-21 LAB — OB RESULTS CONSOLE RPR: RPR: NONREACTIVE

## 2022-01-21 LAB — OB RESULTS CONSOLE HEPATITIS B SURFACE ANTIGEN: Hepatitis B Surface Ag: NEGATIVE

## 2022-01-21 LAB — OB RESULTS CONSOLE HIV ANTIBODY (ROUTINE TESTING): HIV: NONREACTIVE

## 2022-06-16 NOTE — L&D Delivery Note (Signed)
Delivery Note At 8:27 AM a viable and healthy female was delivered via Vaginal, Spontaneous (Presentation: OA ).  APGAR: 7, 9; weight pending .   Placenta status: Spontaneous, Intact.  Cord: 3 vessels with the following complications: Loose nuchal cord x1.  Cord pH: 7.1  Was called into patient room by RN after noting increased vaginal bleeding. Patient was seen at bedside and bright red blood was noted. FHR tracing had been mostly category I prior to this with baseline between 140 and 150 bpm and moderate variability throughout with accelerations noted. Patient examined and found to be fully dilated. We did one set of pushes with a contraction at which point intact membranes with bulging bag of water was appreciated. After the contraction finished, AROM was performed for primarily clear water. Fetal heart deceleration occurred with this, nadir into the 60's , patient was turned to left lateral positioning. With the next contraction patient felt strong sense of pressure and the baby was delivered with one push. Deceleration time total of 5 minutes. Cord blood gasses collected and placenta sent for pathology- calcifications were noted.   Anesthesia: Epidural Episiotomy: None Lacerations: None Suture Repair: N/A Est. Blood Loss (mL): 100  Mom to postpartum.  Baby to Couplet care / Skin to Skin.  Chloe Garrett 07/26/2022, 9:15 AM

## 2022-07-10 LAB — OB RESULTS CONSOLE GBS: GBS: NEGATIVE

## 2022-07-23 ENCOUNTER — Other Ambulatory Visit: Payer: Self-pay

## 2022-07-23 ENCOUNTER — Encounter (HOSPITAL_COMMUNITY): Payer: Self-pay | Admitting: Obstetrics

## 2022-07-23 ENCOUNTER — Inpatient Hospital Stay (HOSPITAL_COMMUNITY)
Admission: AD | Admit: 2022-07-23 | Discharge: 2022-07-23 | Disposition: A | Discharge status: Home / Self Care | Payer: PRIVATE HEALTH INSURANCE | Source: Home / Self Care | Attending: Obstetrics | Admitting: Obstetrics

## 2022-07-23 DIAGNOSIS — O26893 Other specified pregnancy related conditions, third trimester: Secondary | ICD-10-CM | POA: Insufficient documentation

## 2022-07-23 DIAGNOSIS — O4703 False labor before 37 completed weeks of gestation, third trimester: Secondary | ICD-10-CM | POA: Insufficient documentation

## 2022-07-23 DIAGNOSIS — O219 Vomiting of pregnancy, unspecified: Secondary | ICD-10-CM | POA: Insufficient documentation

## 2022-07-23 DIAGNOSIS — Z3A36 36 weeks gestation of pregnancy: Secondary | ICD-10-CM | POA: Insufficient documentation

## 2022-07-23 DIAGNOSIS — A084 Viral intestinal infection, unspecified: Secondary | ICD-10-CM

## 2022-07-23 DIAGNOSIS — O479 False labor, unspecified: Secondary | ICD-10-CM

## 2022-07-23 DIAGNOSIS — O163 Unspecified maternal hypertension, third trimester: Secondary | ICD-10-CM | POA: Diagnosis not present

## 2022-07-23 DIAGNOSIS — R030 Elevated blood-pressure reading, without diagnosis of hypertension: Secondary | ICD-10-CM | POA: Insufficient documentation

## 2022-07-23 DIAGNOSIS — O4593 Premature separation of placenta, unspecified, third trimester: Secondary | ICD-10-CM | POA: Diagnosis not present

## 2022-07-23 LAB — CBC
HCT: 39.2 % (ref 36.0–46.0)
Hemoglobin: 13 g/dL (ref 12.0–15.0)
MCH: 28 pg (ref 26.0–34.0)
MCHC: 33.2 g/dL (ref 30.0–36.0)
MCV: 84.5 fL (ref 80.0–100.0)
Platelets: 213 10*3/uL (ref 150–400)
RBC: 4.64 MIL/uL (ref 3.87–5.11)
RDW: 15.5 % (ref 11.5–15.5)
WBC: 11.7 10*3/uL — ABNORMAL HIGH (ref 4.0–10.5)
nRBC: 0 % (ref 0.0–0.2)

## 2022-07-23 LAB — COMPREHENSIVE METABOLIC PANEL
ALT: 14 U/L (ref 0–44)
AST: 21 U/L (ref 15–41)
Albumin: 2.8 g/dL — ABNORMAL LOW (ref 3.5–5.0)
Alkaline Phosphatase: 114 U/L (ref 38–126)
Anion gap: 15 (ref 5–15)
BUN: 11 mg/dL (ref 6–20)
CO2: 19 mmol/L — ABNORMAL LOW (ref 22–32)
Calcium: 8.5 mg/dL — ABNORMAL LOW (ref 8.9–10.3)
Chloride: 101 mmol/L (ref 98–111)
Creatinine, Ser: 0.63 mg/dL (ref 0.44–1.00)
GFR, Estimated: >60 mL/min (ref >60)
Glucose, Bld: 106 mg/dL — ABNORMAL HIGH (ref 70–99)
Potassium: 3.7 mmol/L (ref 3.5–5.1)
Sodium: 135 mmol/L (ref 135–145)
Total Bilirubin: 0.4 mg/dL (ref 0.3–1.2)
Total Protein: 6.6 g/dL (ref 6.5–8.1)

## 2022-07-23 LAB — URINALYSIS, ROUTINE W REFLEX MICROSCOPIC
Bilirubin Urine: NEGATIVE
Glucose, UA: NEGATIVE mg/dL
Hgb urine dipstick: NEGATIVE
Ketones, ur: 80 mg/dL — AB
Nitrite: NEGATIVE
Protein, ur: 30 mg/dL — AB
Specific Gravity, Urine: 1.023 (ref 1.005–1.030)
pH: 5 (ref 5.0–8.0)

## 2022-07-23 LAB — PROTEIN / CREATININE RATIO, URINE
Creatinine, Urine: 180 mg/dL
Protein Creatinine Ratio: 0.21 mg/mg{Cre} — ABNORMAL HIGH (ref 0.00–0.15)
Total Protein, Urine: 38 mg/dL

## 2022-07-23 MED ORDER — LACTATED RINGERS IV BOLUS
1000.0000 mL | Freq: Once | INTRAVENOUS | Status: AC
  Administered 2022-07-23: 1000 mL via INTRAVENOUS

## 2022-07-23 MED ORDER — CYCLOBENZAPRINE HCL 10 MG PO TABS: 10.0000 mg | ORAL_TABLET | Freq: Two times a day (BID) | ORAL | 0 refills | Status: DC | PRN

## 2022-07-23 MED ORDER — SODIUM CHLORIDE 0.9 % IV SOLN
8.0000 mg | Freq: Once | INTRAVENOUS | Status: DC
  Filled 2022-07-23 (×3): qty 4

## 2022-07-23 MED ORDER — ONDANSETRON HCL 4 MG/2ML IJ SOLN
4.0000 mg | Freq: Once | INTRAMUSCULAR | Status: AC
  Administered 2022-07-23: 4 mg via INTRAVENOUS
  Filled 2022-07-23: qty 2

## 2022-07-23 MED ORDER — ONDANSETRON 4 MG PO TBDP: 4.0000 mg | ORAL_TABLET | Freq: Four times a day (QID) | ORAL | 0 refills | Status: DC | PRN

## 2022-07-23 NOTE — MAU Note (Signed)
Chloe Garrett is a 32 y.o. at [redacted]w[redacted]d here in MAU reporting: started vomiting around 0130 and has been having contractions. Unsure about frequency. No bleeding or LOF. +FM. Pt reports her son had a stomach bug over the weekend but not this bad.  Onset of complaint: today  Pain score: 7/10  Vitals:   07/23/22 0752  BP: (!) 131/98  Pulse: (!) 115  Resp: 18  Temp: (!) 97.5 F (36.4 C)  SpO2: 100%     FHT:+FM, EFM applied  Lab orders placed from triage: UA, unable to give urine sample

## 2022-07-23 NOTE — MAU Note (Signed)
Resent message to pharmacy requesting medication.

## 2022-07-23 NOTE — MAU Provider Note (Signed)
History     [id number]  Arrival date and time: 07/23/22 6962    Chief Complaint  Patient presents with   Contractions   Nausea     HPI Chloe Garrett is a 32 y.o. at [redacted]w[redacted]d with PMHx notable for IUGR in this pregnancy scheduled for 38 wk IOL, PIH in prior pregnancy induced at 37 weeks, who presents for nausea and vomiting.   Review of outside prenatal records from [employer] (in media tab): unremarkable prenatal course to date with normal BP's. Labs normal, baseline UPCR 0.14, on baby ASA.  Patient reports she has been having contractions and nausea/vomiting since last night that have gotten progressively worse Last night it was primarily contractions, initially q1 min but then spaced to 5 min and then longer but persistent Went to bed but around 0100 she awoke and has been having nausea and vomiting about every 15-30 minutes since then Contractions are ongoing but she reports they have been persistent painful BH contractions since around 19 weeks of pregnancy No vaginal bleeding or loss of fluid Normal fetal movement No diarrhea Her son was sick with a stomach bug over the weekend but not to this extent       OB History     Gravida  2   Para  1   Term  1   Preterm      AB      Living  1      SAB      IAB      Ectopic      Multiple  0   Live Births  1           Past Medical History:  Diagnosis Date   Asthma    Vaginal Pap smear, abnormal     Past Surgical History:  Procedure Laterality Date   shoulder     right shoulder   WISDOM TOOTH EXTRACTION      Family History  Problem Relation Age of Onset   Diabetes Mother    Diabetes Father    Hypertension Father     Social History   Socioeconomic History   Marital status: Married    Spouse name: Not on file   Number of children: Not on file   Years of education: Not on file   Highest education level: Not on file  Occupational History   Not on file  Tobacco Use   Smoking  status: Never   Smokeless tobacco: Never  Vaping Use   Vaping Use: Never used  Substance and Sexual Activity   Alcohol use: Never   Drug use: Never   Sexual activity: Yes  Other Topics Concern   Not on file  Social History Narrative   Not on file   Social Determinants of Health   Financial Resource Strain: Not on file  Food Insecurity: Not on file  Transportation Needs: Not on file  Physical Activity: Not on file  Stress: Not on file  Social Connections: Not on file  Intimate Partner Violence: Not on file    Allergies  Allergen Reactions   Codeine Nausea And Vomiting   Latex Itching   Sumatriptan Itching and Other (See Comments)    "all side effects listed with this medication"    No current facility-administered medications on file prior to encounter.   Current Outpatient Medications on File Prior to Encounter  Medication Sig Dispense Refill   aspirin EC 81 MG tablet Take 81 mg by mouth daily. Swallow whole.  budesonide (PULMICORT) 180 MCG/ACT inhaler Inhale 2 puffs into the lungs 2 (two) times daily.     doxylamine, Sleep, (UNISOM) 25 MG tablet Take 25 mg by mouth at bedtime as needed for sleep.     fexofenadine-pseudoephedrine (ALLEGRA-D 24) 180-240 MG 24 hr tablet Take 1 tablet by mouth daily.     montelukast (SINGULAIR) 10 MG tablet Take 10 mg by mouth at bedtime.     Prenatal Vit-Fe Fumarate-FA (PRENATAL MULTIVITAMIN) TABS tablet Take 1 tablet by mouth daily at 12 noon.     promethazine (PHENERGAN) 25 MG tablet Take 25 mg by mouth every 6 (six) hours as needed for nausea or vomiting.     sertraline (ZOLOFT) 50 MG tablet Take 50 mg by mouth daily.     NIFEdipine (ADALAT CC) 30 MG 24 hr tablet Take 1 tablet (30 mg total) by mouth daily. 30 tablet 1     ROS Pertinent positives and negative per HPI, all others reviewed and negative  Physical Exam   BP (!) 133/98   Pulse 99   Temp (!) 97.5 F (36.4 C) (Oral)   Resp 18   Ht 5\' 1"  (1.549 m)   Wt 70.8 kg    SpO2 100%   BMI 29.48 kg/m   Patient Vitals for the past 24 hrs:  BP Temp Temp src Pulse Resp SpO2 Height Weight  07/23/22 0922 (!) 133/98 -- -- 99 -- -- -- --  07/23/22 0810 (!) 130/96 -- -- 100 -- -- -- --  07/23/22 0752 (!) 131/98 (!) 97.5 F (36.4 C) Oral (!) 115 18 100 % -- --  07/23/22 0739 -- -- -- -- -- -- 5\' 1"  (1.549 m) 70.8 kg    Physical Exam Vitals reviewed.  Constitutional:      General: She is not in acute distress.    Appearance: She is well-developed. She is not diaphoretic.  Eyes:     General: No scleral icterus. Pulmonary:     Effort: Pulmonary effort is normal. No respiratory distress.  Skin:    General: Skin is warm and dry.  Neurological:     Mental Status: She is alert.     Coordination: Coordination normal.      Cervical Exam Dilation: 3 Effacement (%): 70 Cervical Position: Middle Station: -3 Presentation: Vertex Exam by:: n druebbisch rn  Bedside Ultrasound Not done  My interpretation: n/a  FHT Baseline 145, moderate variability, +accels, no decels Toco: regular q3-4 min contractions Cat: I  Labs Results for orders placed or performed during the hospital encounter of 07/23/22 (from the past 24 hour(s))  CBC     Status: Abnormal   Collection Time: 07/23/22  8:34 AM  Result Value Ref Range   WBC 11.7 (H) 4.0 - 10.5 K/uL   RBC 4.64 3.87 - 5.11 MIL/uL   Hemoglobin 13.0 12.0 - 15.0 g/dL   HCT 39.2 36.0 - 46.0 %   MCV 84.5 80.0 - 100.0 fL   MCH 28.0 26.0 - 34.0 pg   MCHC 33.2 30.0 - 36.0 g/dL   RDW 15.5 11.5 - 15.5 %   Platelets 213 150 - 400 K/uL   nRBC 0.0 0.0 - 0.2 %  Comprehensive metabolic panel     Status: Abnormal   Collection Time: 07/23/22  8:34 AM  Result Value Ref Range   Sodium 135 135 - 145 mmol/L   Potassium 3.7 3.5 - 5.1 mmol/L   Chloride 101 98 - 111 mmol/L   CO2 19 (L) 22 -  32 mmol/L   Glucose, Bld 106 (H) 70 - 99 mg/dL   BUN 11 6 - 20 mg/dL   Creatinine, Ser 0.63 0.44 - 1.00 mg/dL   Calcium 8.5 (L) 8.9 -  10.3 mg/dL   Total Protein 6.6 6.5 - 8.1 g/dL   Albumin 2.8 (L) 3.5 - 5.0 g/dL   AST 21 15 - 41 U/L   ALT 14 0 - 44 U/L   Alkaline Phosphatase 114 38 - 126 U/L   Total Bilirubin 0.4 0.3 - 1.2 mg/dL   GFR, Estimated >60 >60 mL/min   Anion gap 15 5 - 15  Urinalysis, Routine w reflex microscopic -Urine, Clean Catch     Status: Abnormal   Collection Time: 07/23/22  9:18 AM  Result Value Ref Range   Color, Urine YELLOW YELLOW   APPearance HAZY (A) CLEAR   Specific Gravity, Urine 1.023 1.005 - 1.030   pH 5.0 5.0 - 8.0   Glucose, UA NEGATIVE NEGATIVE mg/dL   Hgb urine dipstick NEGATIVE NEGATIVE   Bilirubin Urine NEGATIVE NEGATIVE   Ketones, ur 80 (A) NEGATIVE mg/dL   Protein, ur 30 (A) NEGATIVE mg/dL   Nitrite NEGATIVE NEGATIVE   Leukocytes,Ua TRACE (A) NEGATIVE   RBC / HPF 0-5 0 - 5 RBC/hpf   WBC, UA 0-5 0 - 5 WBC/hpf   Bacteria, UA RARE (A) NONE SEEN   Squamous Epithelial / HPF 0-5 0 - 5 /HPF   Mucus PRESENT   Protein / creatinine ratio, urine     Status: Abnormal   Collection Time: 07/23/22  9:18 AM  Result Value Ref Range   Creatinine, Urine 180 mg/dL   Total Protein, Urine 38 mg/dL   Protein Creatinine Ratio 0.21 (H) 0.00 - 0.15 mg/mg[Cre]    Imaging No results found.  MAU Course  Procedures Lab Orders         Urinalysis, Routine w reflex microscopic -Urine, Clean Catch         CBC         Comprehensive metabolic panel         Protein / creatinine ratio, urine    Meds ordered this encounter  Medications   lactated ringers bolus 1,000 mL   DISCONTD: ondansetron (ZOFRAN) 8 mg in sodium chloride 0.9 % 50 mL IVPB   ondansetron (ZOFRAN) injection 4 mg   lactated ringers bolus 1,000 mL   ondansetron (ZOFRAN) injection 4 mg   cyclobenzaprine (FLEXERIL) 10 MG tablet    Sig: Take 1 tablet (10 mg total) by mouth 2 (two) times daily as needed for muscle spasms.    Dispense:  20 tablet    Refill:  0   ondansetron (ZOFRAN-ODT) 4 MG disintegrating tablet    Sig: Take 1  tablet (4 mg total) by mouth every 6 (six) hours as needed for nausea.    Dispense:  20 tablet    Refill:  0   Imaging Orders  No imaging studies ordered today    MDM moderate  Assessment and Plan  #Nausea and vomiting of pregnancy #Viral gastroenteritis Suspect viral GI based on son's recent illness this weekend. Significant improvement in symptoms and able to pass PO challenge while in MAU after 2 L LR and 8 mg IV zofran. Per our discussion will send zofran ODT and flexeril to her pharmacy for symptomatic control.   #False labor Regular contractions but cervix unchanged on serial exams.   #Elevated blood pressures in pregnancy without diagnosis of hypertension Hx of PIH in  prior pregnancy. Mild range while here, PreE labs unremarkable. Discussed with Dr. Valentino Saxon, plan for outpatient follow up in two days to recheck BP.   #FWB FHT Cat I NST: Reactive   Dispo: discharged to home in stable condition.   Clarnce Flock, MD/MPH 07/23/22 11:16 AM  Allergies as of 07/23/2022       Reactions   Codeine Nausea And Vomiting   Latex Itching   Sumatriptan Itching, Other (See Comments)   "all side effects listed with this medication"        Medication List     STOP taking these medications    NIFEdipine 30 MG 24 hr tablet Commonly known as: ADALAT CC       TAKE these medications    aspirin EC 81 MG tablet Take 81 mg by mouth daily. Swallow whole.   budesonide 180 MCG/ACT inhaler Commonly known as: PULMICORT Inhale 2 puffs into the lungs 2 (two) times daily.   cyclobenzaprine 10 MG tablet Commonly known as: FLEXERIL Take 1 tablet (10 mg total) by mouth 2 (two) times daily as needed for muscle spasms.   doxylamine (Sleep) 25 MG tablet Commonly known as: UNISOM Take 25 mg by mouth at bedtime as needed for sleep.   fexofenadine-pseudoephedrine 180-240 MG 24 hr tablet Commonly known as: ALLEGRA-D 24 Take 1 tablet by mouth daily.   ondansetron 4 MG  disintegrating tablet Commonly known as: ZOFRAN-ODT Take 1 tablet (4 mg total) by mouth every 6 (six) hours as needed for nausea.   prenatal multivitamin Tabs tablet Take 1 tablet by mouth daily at 12 noon.   promethazine 25 MG tablet Commonly known as: PHENERGAN Take 25 mg by mouth every 6 (six) hours as needed for nausea or vomiting.   sertraline 50 MG tablet Commonly known as: ZOLOFT Take 50 mg by mouth daily.   Singulair 10 MG tablet Generic drug: montelukast Take 10 mg by mouth at bedtime.

## 2022-07-23 NOTE — MAU Note (Signed)
Message sent to pharmacy requesting tubal of Zofran.

## 2022-07-23 NOTE — H&P (Signed)
Note written in error.

## 2022-07-25 ENCOUNTER — Encounter (HOSPITAL_COMMUNITY): Payer: Self-pay | Admitting: *Deleted

## 2022-07-25 ENCOUNTER — Other Ambulatory Visit: Payer: Self-pay

## 2022-07-25 ENCOUNTER — Inpatient Hospital Stay (HOSPITAL_COMMUNITY)
Admission: AD | Admit: 2022-07-25 | Discharge: 2022-07-28 | DRG: 806 | Disposition: A | Discharge status: Home / Self Care | Payer: PRIVATE HEALTH INSURANCE | Attending: Obstetrics and Gynecology | Admitting: Obstetrics and Gynecology

## 2022-07-25 ENCOUNTER — Other Ambulatory Visit: Payer: Self-pay | Admitting: Obstetrics

## 2022-07-25 ENCOUNTER — Telehealth (HOSPITAL_COMMUNITY): Payer: Self-pay | Admitting: *Deleted

## 2022-07-25 ENCOUNTER — Encounter (HOSPITAL_COMMUNITY): Payer: Self-pay | Admitting: Obstetrics and Gynecology

## 2022-07-25 DIAGNOSIS — O1495 Unspecified pre-eclampsia, complicating the puerperium: Secondary | ICD-10-CM | POA: Diagnosis present

## 2022-07-25 DIAGNOSIS — J45909 Unspecified asthma, uncomplicated: Secondary | ICD-10-CM | POA: Diagnosis present

## 2022-07-25 DIAGNOSIS — D62 Acute posthemorrhagic anemia: Secondary | ICD-10-CM | POA: Diagnosis not present

## 2022-07-25 DIAGNOSIS — O9902 Anemia complicating childbirth: Secondary | ICD-10-CM | POA: Diagnosis present

## 2022-07-25 DIAGNOSIS — R748 Abnormal levels of other serum enzymes: Secondary | ICD-10-CM | POA: Diagnosis not present

## 2022-07-25 DIAGNOSIS — O4593 Premature separation of placenta, unspecified, third trimester: Principal | ICD-10-CM | POA: Diagnosis present

## 2022-07-25 DIAGNOSIS — Z3A37 37 weeks gestation of pregnancy: Secondary | ICD-10-CM

## 2022-07-25 DIAGNOSIS — F419 Anxiety disorder, unspecified: Secondary | ICD-10-CM | POA: Diagnosis present

## 2022-07-25 DIAGNOSIS — O365930 Maternal care for other known or suspected poor fetal growth, third trimester, not applicable or unspecified: Secondary | ICD-10-CM | POA: Diagnosis present

## 2022-07-25 DIAGNOSIS — O9952 Diseases of the respiratory system complicating childbirth: Secondary | ICD-10-CM | POA: Diagnosis present

## 2022-07-25 DIAGNOSIS — O99344 Other mental disorders complicating childbirth: Secondary | ICD-10-CM | POA: Diagnosis present

## 2022-07-25 HISTORY — DX: Migraine, unspecified, not intractable, without status migrainosus: G43.909

## 2022-07-25 LAB — URINALYSIS, ROUTINE W REFLEX MICROSCOPIC
Bilirubin Urine: NEGATIVE
Glucose, UA: NEGATIVE mg/dL
Hgb urine dipstick: NEGATIVE
Ketones, ur: NEGATIVE mg/dL
Nitrite: NEGATIVE
Protein, ur: 30 mg/dL — AB
Specific Gravity, Urine: 1.013 (ref 1.005–1.030)
pH: 6 (ref 5.0–8.0)

## 2022-07-25 NOTE — Telephone Encounter (Signed)
Preadmission screen  

## 2022-07-25 NOTE — MAU Note (Signed)
Pt says she has increase vag D/C with red streaks  Feels UC's - strong since after appointment this am Was in office this am- had NST  and BP check- 134/90- told her to come to hospital if changes.  Has H/A started at 8pm- no meds - bc vomiting  Vomit  at 10pm - only time today  Was here Wed  for vomiting - gave Rx for Zofran Today - no Zofran - took Phenergan- took at 3 pm  VE in office 4 cm  Denies HSV GBS- neg

## 2022-07-26 ENCOUNTER — Inpatient Hospital Stay (HOSPITAL_COMMUNITY): Payer: PRIVATE HEALTH INSURANCE | Admitting: Anesthesiology

## 2022-07-26 ENCOUNTER — Encounter (HOSPITAL_COMMUNITY): Payer: Self-pay | Admitting: Obstetrics and Gynecology

## 2022-07-26 ENCOUNTER — Other Ambulatory Visit: Payer: Self-pay

## 2022-07-26 DIAGNOSIS — O1495 Unspecified pre-eclampsia, complicating the puerperium: Secondary | ICD-10-CM | POA: Diagnosis present

## 2022-07-26 DIAGNOSIS — O365930 Maternal care for other known or suspected poor fetal growth, third trimester, not applicable or unspecified: Secondary | ICD-10-CM | POA: Diagnosis present

## 2022-07-26 DIAGNOSIS — O9952 Diseases of the respiratory system complicating childbirth: Secondary | ICD-10-CM | POA: Diagnosis present

## 2022-07-26 DIAGNOSIS — J45909 Unspecified asthma, uncomplicated: Secondary | ICD-10-CM | POA: Diagnosis present

## 2022-07-26 DIAGNOSIS — O4593 Premature separation of placenta, unspecified, third trimester: Secondary | ICD-10-CM | POA: Diagnosis present

## 2022-07-26 DIAGNOSIS — O9902 Anemia complicating childbirth: Secondary | ICD-10-CM | POA: Diagnosis present

## 2022-07-26 DIAGNOSIS — Z3A37 37 weeks gestation of pregnancy: Secondary | ICD-10-CM | POA: Diagnosis not present

## 2022-07-26 DIAGNOSIS — O26893 Other specified pregnancy related conditions, third trimester: Secondary | ICD-10-CM | POA: Diagnosis present

## 2022-07-26 DIAGNOSIS — F419 Anxiety disorder, unspecified: Secondary | ICD-10-CM | POA: Diagnosis present

## 2022-07-26 DIAGNOSIS — O99344 Other mental disorders complicating childbirth: Secondary | ICD-10-CM | POA: Diagnosis present

## 2022-07-26 DIAGNOSIS — D62 Acute posthemorrhagic anemia: Secondary | ICD-10-CM | POA: Diagnosis not present

## 2022-07-26 LAB — COMPREHENSIVE METABOLIC PANEL
ALT: 16 U/L (ref 0–44)
AST: 21 U/L (ref 15–41)
Albumin: 2.3 g/dL — ABNORMAL LOW (ref 3.5–5.0)
Alkaline Phosphatase: 99 U/L (ref 38–126)
Anion gap: 11 (ref 5–15)
BUN: 5 mg/dL — ABNORMAL LOW (ref 6–20)
CO2: 20 mmol/L — ABNORMAL LOW (ref 22–32)
Calcium: 8.3 mg/dL — ABNORMAL LOW (ref 8.9–10.3)
Chloride: 101 mmol/L (ref 98–111)
Creatinine, Ser: 0.59 mg/dL (ref 0.44–1.00)
GFR, Estimated: >60 mL/min (ref >60)
Glucose, Bld: 83 mg/dL (ref 70–99)
Potassium: 3.2 mmol/L — ABNORMAL LOW (ref 3.5–5.1)
Sodium: 132 mmol/L — ABNORMAL LOW (ref 135–145)
Total Bilirubin: 0.3 mg/dL (ref 0.3–1.2)
Total Protein: 5.9 g/dL — ABNORMAL LOW (ref 6.5–8.1)

## 2022-07-26 LAB — CBC WITH DIFFERENTIAL/PLATELET
Abs Immature Granulocytes: 0.08 10*3/uL — ABNORMAL HIGH (ref 0.00–0.07)
Basophils Absolute: 0 10*3/uL (ref 0.0–0.1)
Basophils Relative: 0 %
Eosinophils Absolute: 0 10*3/uL (ref 0.0–0.5)
Eosinophils Relative: 0 %
HCT: 30 % — ABNORMAL LOW (ref 36.0–46.0)
Hemoglobin: 10.1 g/dL — ABNORMAL LOW (ref 12.0–15.0)
Immature Granulocytes: 1 %
Lymphocytes Relative: 13 %
Lymphs Abs: 1.7 10*3/uL (ref 0.7–4.0)
MCH: 28.3 pg (ref 26.0–34.0)
MCHC: 33.7 g/dL (ref 30.0–36.0)
MCV: 84 fL (ref 80.0–100.0)
Monocytes Absolute: 1.1 10*3/uL — ABNORMAL HIGH (ref 0.1–1.0)
Monocytes Relative: 8 %
Neutro Abs: 10 10*3/uL — ABNORMAL HIGH (ref 1.7–7.7)
Neutrophils Relative %: 78 %
Platelets: 169 10*3/uL (ref 150–400)
RBC: 3.57 MIL/uL — ABNORMAL LOW (ref 3.87–5.11)
RDW: 15.5 % (ref 11.5–15.5)
WBC: 12.9 10*3/uL — ABNORMAL HIGH (ref 4.0–10.5)
nRBC: 0 % (ref 0.0–0.2)

## 2022-07-26 LAB — CBC
HCT: 31.9 % — ABNORMAL LOW (ref 36.0–46.0)
Hemoglobin: 10.7 g/dL — ABNORMAL LOW (ref 12.0–15.0)
MCH: 28.2 pg (ref 26.0–34.0)
MCHC: 33.5 g/dL (ref 30.0–36.0)
MCV: 83.9 fL (ref 80.0–100.0)
Platelets: 183 10*3/uL (ref 150–400)
RBC: 3.8 MIL/uL — ABNORMAL LOW (ref 3.87–5.11)
RDW: 15.8 % — ABNORMAL HIGH (ref 11.5–15.5)
WBC: 10.8 10*3/uL — ABNORMAL HIGH (ref 4.0–10.5)
nRBC: 0.2 % (ref 0.0–0.2)

## 2022-07-26 LAB — TYPE AND SCREEN
ABO/RH(D): O POS
Antibody Screen: NEGATIVE

## 2022-07-26 LAB — RPR: RPR Ser Ql: NONREACTIVE

## 2022-07-26 MED ORDER — FENTANYL CITRATE (PF) 100 MCG/2ML IJ SOLN
100.0000 ug | Freq: Once | INTRAMUSCULAR | Status: AC
  Administered 2022-07-26: 50 ug via INTRAVENOUS

## 2022-07-26 MED ORDER — ONDANSETRON HCL 4 MG PO TABS
4.0000 mg | ORAL_TABLET | ORAL | Status: DC | PRN
  Administered 2022-07-26: 4 mg via ORAL
  Filled 2022-07-26: qty 1

## 2022-07-26 MED ORDER — SERTRALINE HCL 50 MG PO TABS
50.0000 mg | ORAL_TABLET | Freq: Every day | ORAL | Status: DC
  Administered 2022-07-26 – 2022-07-27 (×2): 50 mg via ORAL
  Filled 2022-07-26 (×3): qty 1

## 2022-07-26 MED ORDER — METHOCARBAMOL 500 MG PO TABS: 500.0000 mg | ORAL_TABLET | Freq: Three times a day (TID) | ORAL | Status: DC | PRN

## 2022-07-26 MED ORDER — FENTANYL CITRATE (PF) 100 MCG/2ML IJ SOLN
INTRAMUSCULAR | Status: AC
  Filled 2022-07-26: qty 2

## 2022-07-26 MED ORDER — LABETALOL HCL 200 MG PO TABS
200.0000 mg | ORAL_TABLET | Freq: Two times a day (BID) | ORAL | Status: DC
  Administered 2022-07-26 – 2022-07-28 (×4): 200 mg via ORAL
  Filled 2022-07-26 (×4): qty 1

## 2022-07-26 MED ORDER — OXYCODONE-ACETAMINOPHEN 5-325 MG PO TABS: 2.0000 | ORAL_TABLET | ORAL | Status: DC | PRN

## 2022-07-26 MED ORDER — COCONUT OIL OIL
1.0000 | TOPICAL_OIL | Status: DC | PRN
  Administered 2022-07-26: 1 via TOPICAL

## 2022-07-26 MED ORDER — LORATADINE 10 MG PO TABS
10.0000 mg | ORAL_TABLET | Freq: Every day | ORAL | Status: DC
  Administered 2022-07-26 – 2022-07-28 (×3): 10 mg via ORAL
  Filled 2022-07-26 (×3): qty 1

## 2022-07-26 MED ORDER — IBUPROFEN 600 MG PO TABS
600.0000 mg | ORAL_TABLET | Freq: Four times a day (QID) | ORAL | Status: DC
  Administered 2022-07-26 – 2022-07-28 (×7): 600 mg via ORAL
  Filled 2022-07-26 (×10): qty 1

## 2022-07-26 MED ORDER — MONTELUKAST SODIUM 10 MG PO TABS
10.0000 mg | ORAL_TABLET | Freq: Every day | ORAL | Status: DC
  Administered 2022-07-26 – 2022-07-27 (×2): 10 mg via ORAL
  Filled 2022-07-26 (×2): qty 1

## 2022-07-26 MED ORDER — FENTANYL-BUPIVACAINE-NACL 0.5-0.125-0.9 MG/250ML-% EP SOLN
EPIDURAL | Status: AC
  Filled 2022-07-26: qty 250

## 2022-07-26 MED ORDER — LACTATED RINGERS IV SOLN: 500.0000 mL | Freq: Once | INTRAVENOUS | Status: DC

## 2022-07-26 MED ORDER — FENTANYL-BUPIVACAINE-NACL 0.5-0.125-0.9 MG/250ML-% EP SOLN
12.0000 mL/h | EPIDURAL | Status: DC | PRN
  Administered 2022-07-26: 12 mL/h via EPIDURAL

## 2022-07-26 MED ORDER — LACTATED RINGERS IV SOLN: INTRAVENOUS | Status: DC

## 2022-07-26 MED ORDER — TETANUS-DIPHTH-ACELL PERTUSSIS 5-2.5-18.5 LF-MCG/0.5 IM SUSY: 0.5000 mL | PREFILLED_SYRINGE | Freq: Once | INTRAMUSCULAR | Status: DC

## 2022-07-26 MED ORDER — PHENYLEPHRINE 80 MCG/ML (10ML) SYRINGE FOR IV PUSH (FOR BLOOD PRESSURE SUPPORT)
80.0000 ug | PREFILLED_SYRINGE | INTRAVENOUS | Status: DC | PRN
  Administered 2022-07-26: 80 ug via INTRAVENOUS

## 2022-07-26 MED ORDER — DIBUCAINE (PERIANAL) 1 % EX OINT: 1.0000 | TOPICAL_OINTMENT | CUTANEOUS | Status: DC | PRN

## 2022-07-26 MED ORDER — LIDOCAINE HCL (PF) 1 % IJ SOLN: 30.0000 mL | INTRAMUSCULAR | Status: DC | PRN

## 2022-07-26 MED ORDER — OXYTOCIN BOLUS FROM INFUSION
333.0000 mL | Freq: Once | INTRAVENOUS | Status: DC
  Administered 2022-07-26: 333 mL via INTRAVENOUS

## 2022-07-26 MED ORDER — SIMETHICONE 80 MG PO CHEW: 80.0000 mg | CHEWABLE_TABLET | ORAL | Status: DC | PRN

## 2022-07-26 MED ORDER — EPHEDRINE 5 MG/ML INJ: 10.0000 mg | INTRAVENOUS | Status: DC | PRN

## 2022-07-26 MED ORDER — SENNOSIDES-DOCUSATE SODIUM 8.6-50 MG PO TABS
2.0000 | ORAL_TABLET | Freq: Every day | ORAL | Status: DC
  Administered 2022-07-27 – 2022-07-28 (×2): 2 via ORAL
  Filled 2022-07-26 (×2): qty 2

## 2022-07-26 MED ORDER — DIPHENHYDRAMINE HCL 50 MG/ML IJ SOLN: 12.5000 mg | INTRAMUSCULAR | Status: DC | PRN

## 2022-07-26 MED ORDER — ONDANSETRON HCL 4 MG/2ML IJ SOLN
4.0000 mg | Freq: Four times a day (QID) | INTRAMUSCULAR | Status: DC | PRN
  Administered 2022-07-26: 4 mg via INTRAVENOUS
  Filled 2022-07-26 (×2): qty 2

## 2022-07-26 MED ORDER — ONDANSETRON HCL 4 MG/2ML IJ SOLN: 4.0000 mg | INTRAMUSCULAR | Status: DC | PRN

## 2022-07-26 MED ORDER — ACETAMINOPHEN 325 MG PO TABS
650.0000 mg | ORAL_TABLET | ORAL | Status: DC | PRN
  Administered 2022-07-26: 650 mg via ORAL
  Filled 2022-07-26: qty 2

## 2022-07-26 MED ORDER — PHENYLEPHRINE 80 MCG/ML (10ML) SYRINGE FOR IV PUSH (FOR BLOOD PRESSURE SUPPORT)
80.0000 ug | PREFILLED_SYRINGE | INTRAVENOUS | Status: DC | PRN
  Filled 2022-07-26: qty 10

## 2022-07-26 MED ORDER — ACETAMINOPHEN 325 MG PO TABS: 650.0000 mg | ORAL_TABLET | ORAL | Status: DC | PRN

## 2022-07-26 MED ORDER — DIPHENHYDRAMINE HCL 25 MG PO CAPS: 25.0000 mg | ORAL_CAPSULE | Freq: Four times a day (QID) | ORAL | Status: DC | PRN

## 2022-07-26 MED ORDER — OXYCODONE-ACETAMINOPHEN 5-325 MG PO TABS: 1.0000 | ORAL_TABLET | ORAL | Status: DC | PRN

## 2022-07-26 MED ORDER — LIDOCAINE-EPINEPHRINE (PF) 2 %-1:200000 IJ SOLN
INTRAMUSCULAR | Status: DC | PRN
  Administered 2022-07-26: 5 mL via EPIDURAL

## 2022-07-26 MED ORDER — ZOLPIDEM TARTRATE 5 MG PO TABS: 5.0000 mg | ORAL_TABLET | Freq: Every evening | ORAL | Status: DC | PRN

## 2022-07-26 MED ORDER — PRENATAL MULTIVITAMIN CH
1.0000 | ORAL_TABLET | Freq: Every day | ORAL | Status: DC
  Administered 2022-07-26 – 2022-07-28 (×3): 1 via ORAL
  Filled 2022-07-26 (×3): qty 1

## 2022-07-26 MED ORDER — LACTATED RINGERS IV SOLN
500.0000 mL | INTRAVENOUS | Status: DC | PRN
  Administered 2022-07-26: 1000 mL via INTRAVENOUS

## 2022-07-26 MED ORDER — OXYTOCIN-SODIUM CHLORIDE 30-0.9 UT/500ML-% IV SOLN
2.5000 [IU]/h | INTRAVENOUS | Status: DC
  Administered 2022-07-26: 2.5 [IU]/h via INTRAVENOUS
  Filled 2022-07-26: qty 500

## 2022-07-26 MED ORDER — SOD CITRATE-CITRIC ACID 500-334 MG/5ML PO SOLN: 30.0000 mL | ORAL | Status: DC | PRN

## 2022-07-26 MED ORDER — BUTALBITAL-APAP-CAFFEINE 50-325-40 MG PO TABS
2.0000 | ORAL_TABLET | Freq: Four times a day (QID) | ORAL | Status: DC | PRN
  Administered 2022-07-26 – 2022-07-28 (×4): 2 via ORAL
  Filled 2022-07-26 (×4): qty 2

## 2022-07-26 MED ORDER — BENZOCAINE-MENTHOL 20-0.5 % EX AERO
1.0000 | INHALATION_SPRAY | CUTANEOUS | Status: DC | PRN
  Administered 2022-07-26: 1 via TOPICAL
  Filled 2022-07-26: qty 56

## 2022-07-26 MED ORDER — WITCH HAZEL-GLYCERIN EX PADS: 1.0000 | MEDICATED_PAD | CUTANEOUS | Status: DC | PRN

## 2022-07-26 MED ORDER — FLEET ENEMA 7-19 GM/118ML RE ENEM: 1.0000 | ENEMA | RECTAL | Status: DC | PRN

## 2022-07-26 NOTE — Progress Notes (Signed)
Pt's BPs 128/95 @ 1944 and 127/96 @ 2106. Pt still with c/o headaches, Dr. Royce Macadamia from anesthesia has just seen pt. Called Dr. Lanny Cramp regarding pt. RN to call MD for BP >150/100. Will medicate with fioricet again around 2200 when she can have it again.

## 2022-07-26 NOTE — H&P (Signed)
Chloe Garrett is Chloe 32 y.o. female G2P1001 100w0dpresenting for labor.  Pregnancy dated by LMP c/w 7w sono[facility] [place] [place] [facility] [facility] [place]. She had routine prenatal labs that were WNL, Chloe low risk NIPS, and negative AFP1 screen. Normal fetal anatomy scan. Patient did have an elevated 1hr GTT of 140 and followed up with Chloe normal 3hr GTT. Patient has OB history significant for gHTN and threatened PTL in her first pregnancy resulting in antepartum admission and bedrest at 32 weeks with subsequent delivery at 37 weeks. Patient had baseline PIH labs in this pregnancy that were WNL and placed on risk reducing low dose aspirin. She had remained normotensive until this week, had borderline elevated BP in office but normal labs done in MAU. For history of PTL she was followed with serial cervical evaluations in the pregnancy, was noted to be 1-2cm dilated around 32 weeks and received BMZ at that time. She had serial growth [place] which was noteable for FGR. Most recent growth scan done at 34 weeks showing vertex presentation with an EFW of 4#10 (8%) HC 2% and AC 26%. Antenatal fetal testing has remained reassuring.  PMH significant for anxiety on Zoloft, asthma on Pulmicort daily and Albuterol PRN.  Upon presentation to MAU she reported increased N/V and elevated BP at home with intermittent HA. Increased CTXs and some blood streaks but no LOF. Good FM.  Patient Chloe/b her husband "Chloe Garrett for support and they are excited for Chloe second baby boy, name TBD.  OB History     Gravida  2   Para  1   Term  1   Preterm      AB      Living  1      SAB      IAB      Ectopic      Multiple  0   Live Births  1          Past Medical History:  Diagnosis Date   Asthma    Migraines    Vaginal Pap smear, abnormal    Past Surgical History:  Procedure Laterality Date   shoulder  2016   right shoulder   WISDOM TOOTH EXTRACTION     Family History: family history  includes Diabetes in her father and mother; Hypertension in her father; Pancreatic cancer in her maternal grandfather. Social History:  reports that she has never smoked. She has never used smokeless tobacco. She reports that she does not drink alcohol and does not use drugs.     Maternal Diabetes: No Genetic Screening: Normal Maternal Ultrasounds/Referrals: IUGR Fetal Ultrasounds or other Referrals:  None Maternal Substance Abuse:  No Significant Maternal Medications:  Meds include: Zoloft Significant Maternal Lab Results:  Group B Strep negative Other Comments:  None  Review of Systems  Gastrointestinal:  Positive for nausea and vomiting.  Neurological:  Positive for headaches.  All other systems reviewed and are negative.  Per HPI Maternal Exam:  Uterine Assessment: Contraction frequency is regular.  Abdomen: Estimated fetal weight is 5#8.   Fetal presentation: vertex Introitus: Normal vulva. Normal vagina.  Pelvis: adequate for delivery.     Fetal Exam Fetal State Assessment: Category I - tracings are normal.   Physical Exam Vitals reviewed.  Constitutional:      Appearance: She is normal weight.  HENT:     Head: Normocephalic.  Cardiovascular:     Rate and Rhythm: Normal rate.  Pulmonary:  Effort: Pulmonary effort is normal.  Genitourinary:    General: Normal vulva.  Musculoskeletal:        General: Normal range of motion.  Skin:    General: Skin is warm and dry.  Neurological:     Mental Status: She is alert.  Psychiatric:        Mood and Affect: Mood normal.        Behavior: Behavior normal.     Dilation: 9 Effacement (%): 90 Station: -1 Exam by:: Chloe Standard, RN Blood pressure 121/82, pulse 80, temperature 98.4 F (36.9 C), temperature source Oral, resp. rate 16, height 5' 1"$  (1.549 m), weight 72.1 kg, SpO2 99 %, unknown if currently breastfeeding.  Prenatal labs: ABO, Rh:  --/--/O POS (02/10 PB:[id number]) Antibody: NEG (02/10 0026) Rubella:  Immune (08/08 0000) RPR: Nonreactive (08/08 0000)  HBsAg: Negative (08/08 0000)  HIV: Non-reactive (08/08 0000)  GBS: Negative/-- (01/25 0000)   Chemistry Recent Labs  Lab 07/23/22 0834 07/26/22 0026  NA 135 132*  K 3.7 3.2*  CL 101 101  CO2 19* 20*  GLUCOSE 106* 83  BUN 11 5*  CREATININE 0.63 0.59  CALCIUM 8.5* 8.3*  GFRNONAA >60 >60  ANIONGAP 15 11    Recent Labs  Lab 07/23/22 0834 07/26/22 0026  PROT 6.6 5.9*  ALBUMIN 2.8* 2.3*  AST 21 21  ALT 14 16  ALKPHOS 114 99  BILITOT 0.4 0.3   Hematology Recent Labs  Lab 07/23/22 0834 07/26/22 0026  WBC 11.7* 10.8*  RBC 4.64 3.80*  HGB 13.0 10.7*  HCT 39.2 31.9*  MCV 84.5 83.9  MCH 28.0 28.2  MCHC 33.2 33.5  RDW 15.5 15.8*  PLT 213 183   Cardiac EnzymesNo results for input(s): "TROPONINI" in the last 168 hours. No results for input(s): "TROPIPOC" in the last 168 hours.  BNPNo results for input(s): "BNP", "PROBNP" in the last 168 hours.  DDimer No results for input(s): "DDIMER" in the last 168 hours.   Assessment/Plan: Chloe Garrett is Chloe 32 y.o. female G2P1001 23w0dadmitted with early labor and elevated BP meeting criteria for gHTN complicated by FGR  -Admission to LD -Routine admission labs -GHTN: PDe Sotolabs added to admission labs. BP's mild range elevation, no evidence of PEC at this time but will cont to monitor -Cont EFM/Toco -FGR: Cat I tracing, cont to monitor, s/p BMZ at 34w -Epidural labor pain mgmt -GBS NEG -RH POS -Anxiety cont home Zoloft, monitor for PPD -Routine intrapartum care -Anticipate SVD  Chloe Garrett Chloe Garrett 07/26/2022, 8:07 AM

## 2022-07-26 NOTE — Progress Notes (Addendum)
Called by RN earlier regarding patient complaining of HA and fluid leaking from epidural insertion site. She has had elevated BP's post partum and was started on Labetalol. She describes her HA as throbbing and somewhat positional. She reports photophobia but no tinnitus. She does have some neck stiffness. There was no report of dural puncture at the time of placement of her epidural catheter.At the present time it is early for the development of a PDPHA but a possibility. I have discussed this with the patient and at the present time would recommend increased oral fluid intake, caffeine containing beverages. We will reevaluate her in the am. She can have Robaxin as needed for neck spasms or discomfort and continue Fiorcet as needed for her HA.

## 2022-07-26 NOTE — Lactation Note (Signed)
This note was copied from a baby's chart. Lactation Consultation Note  Patient Name: Chloe Garrett [id number] Date: 07/26/2022 Age : 32 mins  Reason for consult: L&D Initial assessment;Early term 37-38.6wks;Breastfeeding assistance;RN request;Mother's request (LD RN called the [place] ,per RN mom had changed her mind and desired to see [place] in L/D. Baby STS and per RN had attempted to latch. LC offered to assist, and baby on and off at 1st and then re- latched for good 5 mins.) Parents aware LC will F/U on MBU.   Maternal Data Has patient been taught Hand Expression?: Yes (excellent flow - several drops) Does the patient have breastfeeding experience prior to this delivery?: Yes How long did the patient breastfeed?: per mom 1st baby latching was challenging , and ended up pumping 4 months.  Baby seemed not to tolerate breast milk and ended up switching to formula / good milk supply  Feeding Mother's Current Feeding Choice: Breast Milk and Formula  LATCH Score Latch: Grasps breast easily, tongue down, lips flanged, rhythmical sucking.  Audible Swallowing: None  Type of Nipple: Everted at rest and after stimulation  Comfort (Breast/Nipple): Soft / non-tender  Hold (Positioning): Assistance needed to correctly position infant at breast and maintain latch.  LATCH Score: 7   Lactation Tools Discussed/Used    Interventions Interventions: Breast feeding basics reviewed;Assisted with latch;Skin to skin;Hand express;Adjust position;Support pillows;Education  Discharge Pump: Personal  Consult Status Consult Status: Follow-up from L&D Date: 07/26/22 Follow-up type: In-patient    [place] 07/26/2022, 9:49 AM

## 2022-07-26 NOTE — MAU Provider Note (Signed)
S: Ms. Chloe Garrett is a 32 y.o. G2P1001 at [redacted]w[redacted]d who presents to MAU today complaining contractions q 3-4 minutes since leaving her office visit today. She endorses vaginal bleeding (bloody show). She denies LOF. She reports normal fetal movement. Also reports a slight headache with some dizziness, elevated BP at home (134/90), nausea with an episode of vomiting at 10pm - has not taken anything for her headache since she's been nauseated. Took phenergan at 4pm without relief. Was treated for a GI illness earlier in the week and has not gotten over it entirely.  Receives care from [facility] Prenatal records reviewed.  O: BP (!) 129/96 (BP Location: Right Arm)   Pulse 84   Temp 98.3 F (36.8 C) (Oral)   Resp 16   Ht 5' 1"$  (1.549 m)   Wt 158 lb 14.4 oz (72.1 kg)   SpO2 99%   BMI 30.02 kg/m  GENERAL: Well-developed, well-nourished female in no acute distress.  HEAD: Normocephalic, atraumatic.  CHEST: Normal effort of breathing, regular heart rate ABDOMEN: Soft, nontender, gravid  Cervical exam:  Dilation: 4.5 Effacement (%): 80, 90 Station: -1 Presentation: Vertex Exam by:: K Faucett RN  Fetal Monitoring: minimal reactivity Baseline: 130 Variability: minimal Accelerations: 10x10 Decelerations: none Contractions: q351m, pt can talk through them  Pt's BP elevated >140/90 on arrival to MAU, meets criteria for gestational hypertension. Called Dr. LaLanny Crampo discuss pt's clear desire for IOL, she agreed to admit the patient to L&D for IOL for gHTN at term. Orders placed to expedite pt transfer, [facility]  WaGabriel CarinaCNM 07/26/2022 12:09 AM

## 2022-07-26 NOTE — Lactation Note (Signed)
This note was copied from a baby's chart. Lactation Consultation Note  Patient Name: Boy Lasonja Spehar [id number] Date: 07/26/2022 Reason for consult: Initial assessment;Early term 37-38.6wks;Breastfeeding assistance Age:32  LC entered the room and the support person was feeding the infant.  The birth parent stated that the infant has latched for about 5 min at the breast.  She stated that the latch was a bit uncomfortable, but not too bad.  The birth parent commented that her older child had a lip tie and she did not breastfeed for long, but she made a lot of milk.  The birth parent also said that her older child did not tolerate breast milk and she had to get rid of her stash.  She said that she made a lot of milk, but the infant did not take it.  The birth parent would like to mostly pump and will be using her hands-free pump.  LC informed the birth parent that the hands-free pump may not work as well as the DEBP when establishing milk supply.  The birth parent would like to try her pump first and she may call to be set up with a DEBP. LC reviewed outpatient services.  LC put her name on the board and encouraged the birth parent to call if she would like assistance with breastfeeding or pumping.   Infant Feeding Plan:  Breastfeed 8+ times in 24 hours according to feeding cues.  Put the infant to the breast before breastfeeding.  Hand express and feed the infant the expressed milk via a spoon.  Call [place] for assistance with breastfeeding.   Maternal Data Has patient been taught Hand Expression?: Yes Does the patient have breastfeeding experience prior to this delivery?: Yes How long did the patient breastfeed?: She attempted with her first for 1 week & the infant had a lip tie  Feeding Mother's Current Feeding Choice: Breast Milk and Formula  LATCH Score Latch: Grasps breast easily, tongue down, lips flanged, rhythmical sucking.  Audible Swallowing: None  Type of Nipple:  Everted at rest and after stimulation  Comfort (Breast/Nipple): Soft / non-tender  Hold (Positioning): Assistance needed to correctly position infant at breast and maintain latch.  LATCH Score: 7   Lactation Tools Discussed/Used    Interventions Interventions: [facility] brochure  Discharge Pump: DEBP;Hands Free;Personal  Consult Status Consult Status: Follow-up Date: 07/27/22 Follow-up type: In-patient    Elly Modena Fines Kimberlin 07/26/2022, 11:22 AM

## 2022-07-26 NOTE — Anesthesia Procedure Notes (Signed)
Epidural Patient location during procedure: OB Start time: 07/26/2022 6:40 AM End time: 07/26/2022 6:50 AM  Staffing Anesthesiologist: Freddrick March, MD Performed: anesthesiologist   Preanesthetic Checklist Completed: patient identified, IV checked, risks and benefits discussed, monitors and equipment checked, pre-op evaluation and timeout performed  Epidural Patient position: sitting Prep: DuraPrep and site prepped and draped Patient monitoring: continuous pulse ox, blood pressure, heart rate and cardiac monitor Approach: midline Location: L3-L4 Injection technique: LOR air  Needle:  Needle type: Tuohy  Needle gauge: 17 G Needle length: 9 cm Needle insertion depth: 4 cm Catheter type: closed end flexible Catheter size: 19 Gauge Catheter at skin depth: 10 cm Test dose: negative  Assessment Sensory level: T8 Events: blood not aspirated, no cerebrospinal fluid, injection not painful, no injection resistance, no paresthesia and negative IV test  Additional Notes Patient identified. Risks/Benefits/Options discussed with patient including but not limited to bleeding, infection, nerve damage, paralysis, failed block, incomplete pain control, headache, blood pressure changes, nausea, vomiting, reactions to medication both or allergic, itching and postpartum back pain. Confirmed with bedside nurse the patient's most recent platelet count. Confirmed with patient that they are not currently taking any anticoagulation, have any bleeding history or any family history of bleeding disorders. Patient expressed understanding and wished to proceed. All questions were answered. Sterile technique was used throughout the entire procedure. Please see nursing notes for vital signs. Test dose was given through epidural catheter and negative prior to continuing to dose epidural or start infusion. Warning signs of high block given to the patient including shortness of breath, tingling/numbness in hands,  complete motor block, or any concerning symptoms with instructions to call for help. Patient was given instructions on fall risk and not to get out of bed. All questions and concerns addressed with instructions to call with any issues or inadequate analgesia.  Reason for block:procedure for pain

## 2022-07-26 NOTE — Anesthesia Preprocedure Evaluation (Signed)
Anesthesia Evaluation  Patient identified by MRN, date of birth, ID band Patient awake    Reviewed: Allergy & Precautions, NPO status , Patient's Chart, lab work & pertinent test results  Airway Mallampati: II  TM Distance: >3 FB Neck ROM: Full    Dental no notable dental hx.    Pulmonary asthma    Pulmonary exam normal breath sounds clear to auscultation       Cardiovascular hypertension (gHTN), Normal cardiovascular exam Rhythm:Regular Rate:Normal     Neuro/Psych  Headaches  negative psych ROS   GI/Hepatic negative GI ROS, Neg liver ROS,,,  Endo/Other  negative endocrine ROS    Renal/GU negative Renal ROS  negative genitourinary   Musculoskeletal negative musculoskeletal ROS (+)    Abdominal   Peds  Hematology negative hematology ROS (+)   Anesthesia Other Findings IOL for gHTN  Reproductive/Obstetrics (+) Pregnancy                             Anesthesia Physical Anesthesia Plan  ASA: 3  Anesthesia Plan: Epidural   Post-op Pain Management:    Induction:   PONV Risk Score and Plan: Treatment may vary due to age or medical condition  Airway Management Planned: Natural Airway  Additional Equipment:   Intra-op Plan:   Post-operative Plan:   Informed Consent: I have reviewed the patients History and Physical, chart, labs and discussed the procedure including the risks, benefits and alternatives for the proposed anesthesia with the patient or authorized representative who has indicated his/her understanding and acceptance.       Plan Discussed with: Anesthesiologist  Anesthesia Plan Comments: (Patient identified. Risks, benefits, options discussed with patient including but not limited to bleeding, infection, nerve damage, paralysis, failed block, incomplete pain control, headache, blood pressure changes, nausea, vomiting, reactions to medication, itching, and post partum  back pain. Confirmed with bedside nurse the patient's most recent platelet count. Confirmed with the patient that they are not taking any anticoagulation, have any bleeding history or any family history of bleeding disorders. Patient expressed understanding and wishes to proceed. All questions were answered. )       Anesthesia Quick Evaluation

## 2022-07-27 LAB — CBC
HCT: 26.2 % — ABNORMAL LOW (ref 36.0–46.0)
Hemoglobin: 8.7 g/dL — ABNORMAL LOW (ref 12.0–15.0)
MCH: 28.2 pg (ref 26.0–34.0)
MCHC: 33.2 g/dL (ref 30.0–36.0)
MCV: 85.1 fL (ref 80.0–100.0)
Platelets: 142 10*3/uL — ABNORMAL LOW (ref 150–400)
RBC: 3.08 MIL/uL — ABNORMAL LOW (ref 3.87–5.11)
RDW: 15.6 % — ABNORMAL HIGH (ref 11.5–15.5)
WBC: 9 10*3/uL (ref 4.0–10.5)
nRBC: 0 % (ref 0.0–0.2)

## 2022-07-27 LAB — COMPREHENSIVE METABOLIC PANEL
ALT: 23 U/L (ref 0–44)
AST: 28 U/L (ref 15–41)
Albumin: 1.9 g/dL — ABNORMAL LOW (ref 3.5–5.0)
Alkaline Phosphatase: 74 U/L (ref 38–126)
Anion gap: 9 (ref 5–15)
BUN: 6 mg/dL (ref 6–20)
CO2: 22 mmol/L (ref 22–32)
Calcium: 7.6 mg/dL — ABNORMAL LOW (ref 8.9–10.3)
Chloride: 106 mmol/L (ref 98–111)
Creatinine, Ser: 0.61 mg/dL (ref 0.44–1.00)
GFR, Estimated: >60 mL/min (ref >60)
Glucose, Bld: 95 mg/dL (ref 70–99)
Potassium: 3.2 mmol/L — ABNORMAL LOW (ref 3.5–5.1)
Sodium: 137 mmol/L (ref 135–145)
Total Bilirubin: 0.3 mg/dL (ref 0.3–1.2)
Total Protein: 4.4 g/dL — ABNORMAL LOW (ref 6.5–8.1)

## 2022-07-27 MED ORDER — POTASSIUM CHLORIDE CRYS ER 20 MEQ PO TBCR
20.0000 meq | EXTENDED_RELEASE_TABLET | Freq: Two times a day (BID) | ORAL | Status: DC
  Administered 2022-07-27 – 2022-07-28 (×2): 20 meq via ORAL
  Filled 2022-07-27 (×3): qty 1

## 2022-07-27 MED ORDER — FAMOTIDINE 20 MG PO TABS
20.0000 mg | ORAL_TABLET | Freq: Every day | ORAL | Status: DC
  Administered 2022-07-27 – 2022-07-28 (×2): 20 mg via ORAL
  Filled 2022-07-27 (×2): qty 1

## 2022-07-27 MED ORDER — PROMETHAZINE HCL 25 MG PO TABS: 25.0000 mg | ORAL_TABLET | Freq: Four times a day (QID) | ORAL | Status: DC | PRN

## 2022-07-27 MED ORDER — POLYETHYLENE GLYCOL 3350 17 G PO PACK
17.0000 g | PACK | Freq: Every day | ORAL | Status: DC
  Administered 2022-07-27 – 2022-07-28 (×2): 17 g via ORAL
  Filled 2022-07-27 (×2): qty 1

## 2022-07-27 MED ORDER — MAGNESIUM OXIDE -MG SUPPLEMENT 400 (240 MG) MG PO TABS
200.0000 mg | ORAL_TABLET | Freq: Every day | ORAL | Status: DC
  Administered 2022-07-27 – 2022-07-28 (×2): 200 mg via ORAL
  Filled 2022-07-27 (×2): qty 1

## 2022-07-27 MED ORDER — POLYSACCHARIDE IRON COMPLEX 150 MG PO CAPS
150.0000 mg | ORAL_CAPSULE | Freq: Every day | ORAL | Status: DC
  Administered 2022-07-27 – 2022-07-28 (×2): 150 mg via ORAL
  Filled 2022-07-27 (×2): qty 1

## 2022-07-27 NOTE — Progress Notes (Signed)
MOB was referred for history of depression/anxiety. * Referral screened out by Clinical Social Worker because none of the following criteria appear to apply: ~ History of anxiety/depression during this pregnancy, or of post-partum depression following prior delivery. ~ Diagnosis of anxiety and/or depression within last 3 years OR * MOB's symptoms currently being treated with medication and/or therapy. MOB takes sertraline 67m daily.  Please contact the Clinical Social Worker if needs arise, [facility], or if MOB scores greater than 9/yes to question 10 on Edinburgh Postpartum Depression Screen.  Signed,  MBerniece Salines MSW, LCSWA, LCASA 07/27/2022 9:34 AM

## 2022-07-27 NOTE — Progress Notes (Signed)
Post Partum Day One Subjective: Patient sitting up in bed resting comfortably. She reports ongoing HA since admission. Does endorse history of HA and states when she gets bad HA the nausea typically gets worse. Does endorse nausea as well but has been able to keep down PO liquids and solids. HA does get somewhat more intense upon sitting upright and standing. No CP or SOB. Swelling in LE and hands has decreased since yesterday. No dizziness with ambulation. Voiding spontaneously. VB reported as light, no clotting.  Baby boy doing well at bedside, working on BF.  Objective: Patient Vitals for the past 24 hrs:  BP Temp Temp src Pulse Resp SpO2  07/27/22 0619 121/84 97.9 F (36.6 C) Oral 78 18 96 %  07/26/22 2300 (!) 120/90 98 F (36.7 C) Oral 81 18 98 %  07/26/22 2106 (!) 127/96 -- -- 84 -- --  07/26/22 1944 (!) 128/95 98.4 F (36.9 C) Oral 83 18 98 %  07/26/22 1833 (!) 136/94 -- -- -- -- --  07/26/22 1730 (!) 134/96 -- -- -- -- --  07/26/22 1530 (!) 148/103 98.1 F (36.7 C) Oral 71 17 100 %  07/26/22 1200 (!) 137/97 -- -- 79 17 100 %    Physical Exam:  General: alert and no distress Lochia: appropriate Uterine Fundus: firm DVT Evaluation: No evidence of DVT seen on physical exam.  Recent Labs    07/26/22 0026 07/26/22 1032 07/27/22 0405  WBC 10.8* 12.9* 9.0  HGB 10.7* 10.1* 8.7*  HCT 31.9* 30.0* 26.2*  PLT 183 169 142*    Recent Labs    07/26/22 0026 07/27/22 0405  NA 132* 137  K 3.2* 3.2*  CL 101 106  BUN 5* 6  CREATININE 0.59 0.61  GLUCOSE 83 95  BILITOT 0.3 0.3  ALT 16 23  AST 21 28  ALKPHOS 99 74  PROT 5.9* 4.4*  ALBUMIN 2.3* 1.9*    Recent Labs    07/26/22 0026 07/27/22 0405  CALCIUM 8.3* 7.6*    No results for input(s): "PROTIME", "APTT", "INR" in the last 72 hours.  No results for input(s): "PROTIME", "APTT", "INR", "FIBRINOGEN" in the last 72 hours. Assessment/Plan: Plan for discharge tomorrow  Chloe Garrett 32 y.o. G2P2002 PPD#1 sp NSVD at  37.0 presenting in labor with new diagnosis of gHTN and pregnancy significant for FGR 1. PPC: encourage ambulation, regular diet, and routine PP care 2. gHTN: mild range BP since admission, started on PO Labetalol 200 mg BID last night, AM BP WNL. Patient has hx of HA and self discontinued Procardia in previous pregnancy, so Labetalol chosen this time. Does have ongoing HA, s/p anesthesia consult. Does have hx of HA as well. No other PEC sxs. Will try treating HA with combination of Fioricet and Phenergan and monitor for improvement. Reviewed [place] labs on admission and repeat this morning WNL. Will continue to monitor BP's closely and titrate Labetalol as needed and reassess HA after medications. 3. Acute blood loss anemia: reviewed Hgb 8.7 this AM, will start PO Fe  4. Mild hypokalemia K+ 3.2 this morning, pt would like to try eating/drinking and see if it self corrects since she had been having N/V prior to admission, will repeat labs in AM 5. Anxiety cont Zoloft 6. RH POS/RI 7. Lactation support PRN  Plan for continued inpatient care to follow BP and HA and patient agrees   LOS: 1 day   Hilaria Titsworth A Jamell Opfer 07/27/2022, 11:38 AM

## 2022-07-27 NOTE — Lactation Note (Signed)
This note was copied from a baby's chart. Lactation Consultation Note  Patient Name: Chloe Garrett [id number] Date: 07/27/2022 Reason for consult: Follow-up assessment;Early term 37-38.6wks;Breastfeeding assistance;Infant weight loss (3.55% WL) Age:32 hours  LC entered the room and the infant was asleep in the bassinet.  The birth parent stated that the infant has been feeding from the bottle and is doing well.  She said that she has not been pumping due to not feeling well.  She commented that she will continue to pump when she feels better, but she is going to continue with the bottles. The birth parent preferred to be completed and does not wish to continue to receive assistance from lactation.   Feeding Mother's Current Feeding Choice: Formula Nipple Type: Dr. Clement Husbands  Consult Status Consult Status: Complete (mother declined follow up) Date: 07/27/22 Follow-up type: Call as needed    Lysbeth Penner 07/27/2022, 2:41 PM

## 2022-07-27 NOTE — Anesthesia Post-op Follow-up Note (Signed)
Pt re-evaluated this afternoon regarding headache that was present approximately 3 hours after delivery. Upon entering the room, pt was sitting upright feeding baby. She still endorses a headache in the frontal and nuchal locations. 3/10. She states that it has improved since yesterday. She is able to ambulate and take care of baby without difficulty. Motrin and fioricet have helped. There was no obvious wet tap during epidural placement. She would like to continue with conservative management at this time. She was instructed to alert [place] if the headache worsens or if she would like to discuss other forms of management. Pt was amenable to this plan.

## 2022-07-28 DIAGNOSIS — R748 Abnormal levels of other serum enzymes: Secondary | ICD-10-CM | POA: Diagnosis not present

## 2022-07-28 LAB — CBC
HCT: 27.9 % — ABNORMAL LOW (ref 36.0–46.0)
Hemoglobin: 9.2 g/dL — ABNORMAL LOW (ref 12.0–15.0)
MCH: 28.1 pg (ref 26.0–34.0)
MCHC: 33 g/dL (ref 30.0–36.0)
MCV: 85.3 fL (ref 80.0–100.0)
Platelets: 174 10*3/uL (ref 150–400)
RBC: 3.27 MIL/uL — ABNORMAL LOW (ref 3.87–5.11)
RDW: 16 % — ABNORMAL HIGH (ref 11.5–15.5)
WBC: 9.6 10*3/uL (ref 4.0–10.5)
nRBC: 0.3 % — ABNORMAL HIGH (ref 0.0–0.2)

## 2022-07-28 LAB — COMPREHENSIVE METABOLIC PANEL
ALT: 40 U/L (ref 0–44)
AST: 42 U/L — ABNORMAL HIGH (ref 15–41)
Albumin: 2 g/dL — ABNORMAL LOW (ref 3.5–5.0)
Alkaline Phosphatase: 71 U/L (ref 38–126)
Anion gap: 8 (ref 5–15)
BUN: <5 mg/dL — ABNORMAL LOW (ref 6–20)
CO2: 23 mmol/L (ref 22–32)
Calcium: 7.8 mg/dL — ABNORMAL LOW (ref 8.9–10.3)
Chloride: 105 mmol/L (ref 98–111)
Creatinine, Ser: 0.65 mg/dL (ref 0.44–1.00)
GFR, Estimated: >60 mL/min (ref >60)
Glucose, Bld: 82 mg/dL (ref 70–99)
Potassium: 3.7 mmol/L (ref 3.5–5.1)
Sodium: 136 mmol/L (ref 135–145)
Total Bilirubin: <0.1 mg/dL — ABNORMAL LOW (ref 0.3–1.2)
Total Protein: 5 g/dL — ABNORMAL LOW (ref 6.5–8.1)

## 2022-07-28 MED ORDER — MAGNESIUM OXIDE -MG SUPPLEMENT 400 (240 MG) MG PO TABS: 200.0000 mg | ORAL_TABLET | Freq: Every day | ORAL | 1 refills | Status: AC

## 2022-07-28 MED ORDER — LABETALOL HCL 200 MG PO TABS: 200.0000 mg | ORAL_TABLET | Freq: Two times a day (BID) | ORAL | 1 refills | Status: AC

## 2022-07-28 MED ORDER — POLYSACCHARIDE IRON COMPLEX 150 MG PO CAPS: 150.0000 mg | ORAL_CAPSULE | Freq: Every day | ORAL | 1 refills | Status: AC

## 2022-07-28 NOTE — Anesthesia Postprocedure Evaluation (Signed)
Anesthesia Post Note  Patient: Chloe Garrett  Procedure(s) Performed: AN AD HOC LABOR EPIDURAL     Anesthesia Type: Epidural Anesthetic complications: no   No notable events documented.  Last Vitals:  Vitals:   07/27/22 2010 07/28/22 0550  BP: 117/88 119/86  Pulse: 87 77  Resp: 18 18  Temp: 36.8 C 36.7 C  SpO2: 97% 98%    Last Pain:  Vitals:   07/28/22 0745  TempSrc:   PainSc: 0-No pain                 Estefano Victory A.

## 2022-07-28 NOTE — Anesthesia Postprocedure Evaluation (Signed)
Anesthesia Post Note  Patient: Chloe Garrett  Procedure(s) Performed: AN AD [place]     Patient location during evaluation: Mother Baby Anesthesia Type: Epidural Comments: Patient has HA, unclear if related to epidural.   No notable events documented.  Last Vitals:  Vitals:   07/27/22 2010 07/28/22 0550  BP: 117/88 119/86  Pulse: 87 77  Resp: 18 18  Temp: 36.8 C 36.7 C  SpO2: 97% 98%    Last Pain:  Vitals:   07/28/22 0745  TempSrc:   PainSc: 0-No pain                 Shaine Mount A.

## 2022-07-28 NOTE — Discharge Summary (Signed)
OB Discharge Summary  Patient Name: Chloe Garrett DOB: 08-20-1990 MRN: [id number]  Date of admission: 07/25/2022 Delivering provider: LAW, Vito Backers A   Admitting diagnosis: Indication for care in labor or delivery [O75.9] Intrauterine pregnancy: [redacted]w[redacted]d    Secondary diagnosis: Patient Active Problem List   Diagnosis Date Noted   Elevated liver enzymes 07/28/2022   Indication for care in labor or delivery 07/26/2022   SVD (spontaneous vaginal delivery) 04/26/2020   Postpartum care following vaginal delivery 2/10 04/26/2020    Date of discharge: 07/28/2022   Discharge diagnosis: Principal Problem:   Postpartum care following vaginal delivery 2/10 Active Problems:   SVD (spontaneous vaginal delivery)   Indication for care in labor or delivery   Elevated liver enzymes                                                            Augmentation: N/A Pain control: Epidural  Laceration:None  Complications: Placental Abruption  Hospital course:  Onset of Labor With Vaginal Delivery      32y.o. yo [id number] 38w0das admitted in Active Labor on 07/25/2022. Labor course was complicated by placenta abruption.  Membrane Rupture Time/Date: 7:21 AM ,07/26/2022   Delivery Method:Vaginal, Spontaneous  Episiotomy: None  Lacerations:  None  Patient had a postpartum course complicated by preeclampsia. She was started on Labetalol 20011mID and had a small bump in her LFTs on PP day #2. She strongly desired discharge and is asymptomatic. She will RTO in 2-3 days for repeat labs and a BP check. She is ambulating, tolerating a regular diet, passing flatus, and urinating well. Patient is discharged home in stable condition on 07/28/22.  Newborn Data: Birth date:07/26/2022  Birth time:8:27 AM  Gender:Female  Living status:Living  Apgars:7 ,9  Weight:2560 g   Physical exam  Vitals:   07/27/22 1217 07/27/22 1846 07/27/22 2010 07/28/22 0550  BP: 104/82 123/86 117/88 119/86  Pulse: 71  87 77  Resp: 16   18 18  $ Temp:   98.3 F (36.8 C) 98.1 F (36.7 C)  TempSrc:   Oral   SpO2:   97% 98%  Weight:      Height:       General: alert, cooperative, and no distress Lochia: appropriate Uterine Fundus: firm Perineum: intact DVT Evaluation: No evidence of DVT seen on physical exam.  Labs: Lab Results  Component Value Date   WBC 9.6 07/28/2022   HGB 9.2 (L) 07/28/2022   HCT 27.9 (L) 07/28/2022   MCV 85.3 07/28/2022   PLT 174 07/28/2022      Latest Ref Rng & Units 07/28/2022    5:55 AM  CMP  Glucose 70 - 99 mg/dL 82   BUN 6 - 20 mg/dL <5   Creatinine 0.44 - 1.00 mg/dL 0.65   Sodium 135 - 145 mmol/L 136   Potassium 3.5 - 5.1 mmol/L 3.7   Chloride 98 - 111 mmol/L 105   CO2 22 - 32 mmol/L 23   Calcium 8.9 - 10.3 mg/dL 7.8   Total Protein 6.5 - 8.1 g/dL 5.0   Total Bilirubin 0.3 - 1.2 mg/dL <0.1   Alkaline Phos 38 - 126 U/L 71   AST 15 - 41 U/L 42   ALT 0 - 44 U/L 40  07/27/2022   10:00 AM 04/27/2020    8:30 PM  Edinburgh Postnatal Depression Scale Screening Tool  I have been able to laugh and see the funny side of things. 0 0  I have looked forward with enjoyment to things. 0 0  I have blamed myself unnecessarily when things went wrong. 0 1  I have been anxious or worried for no good reason. 0 0  I have felt scared or panicky for no good reason. 0 1  Things have been getting on top of me. 0 0  I have been so unhappy that I have had difficulty sleeping. 0 0  I have felt sad or miserable. 0 0  I have been so unhappy that I have been crying. 0 0  The thought of harming myself has occurred to me. 0 0  Edinburgh Postnatal Depression Scale Total 0 2   Discharge instructions:  per After Visit Summary  After Visit Meds:  Allergies as of 07/28/2022       Reactions   Codeine Nausea And Vomiting   Other Reaction(s): Not available   Latex Itching   Other Reaction(s): Not available   Sumatriptan Itching, Other (See Comments)   "all side effects listed with this  medication" Other Reaction(s): Not available        Medication List     STOP taking these medications    aspirin EC 81 MG tablet   cyclobenzaprine 10 MG tablet Commonly known as: FLEXERIL   doxylamine (Sleep) 25 MG tablet Commonly known as: UNISOM   esomeprazole 20 MG capsule Commonly known as: NEXIUM   famotidine 20 MG tablet Commonly known as: PEPCID   ondansetron 4 MG disintegrating tablet Commonly known as: ZOFRAN-ODT   promethazine 25 MG tablet Commonly known as: PHENERGAN       TAKE these medications    budesonide 180 MCG/ACT inhaler Commonly known as: PULMICORT Inhale 2 puffs into the lungs 2 (two) times daily.   fexofenadine-pseudoephedrine 180-240 MG 24 hr tablet Commonly known as: ALLEGRA-D 24 Take 1 tablet by mouth daily.   iron polysaccharides 150 MG capsule Commonly known as: Ferrex 150 Take 1 capsule (150 mg total) by mouth daily.   labetalol 200 MG tablet Commonly known as: NORMODYNE Take 1 tablet (200 mg total) by mouth 2 (two) times daily.   magnesium oxide 400 (240 Mg) MG tablet Commonly known as: MAG-OX Take 0.5 tablets (200 mg total) by mouth daily. Start taking on: July 29, 2022   prenatal multivitamin Tabs tablet Take 1 tablet by mouth daily at 12 noon.   sertraline 50 MG tablet Commonly known as: ZOLOFT Take 50 mg by mouth daily.   Singulair 10 MG tablet Generic drug: montelukast Take 10 mg by mouth at bedtime.       Activity: Advance as tolerated. Pelvic rest for 6 weeks.   Newborn Data: Live born female  Birth Weight: 5 lb 10.3 oz (2560 g) APGAR: 7, 9  Newborn Delivery   Birth date/time: 07/26/2022 08:27:00 Delivery type: Vaginal, Spontaneous      Named Liam Baby Feeding: Bottle and Breast Circumcision: Planning outpatient with pediatrician Disposition:home with mother  Delivery Report:  Review the Delivery Report for details.    Follow up:  Follow-up Information     Obgyn, [facility] Officer. Schedule an  appointment as soon as possible for a visit in 2 day(s).   Why: For repeat labs and BP check. Contact information: [address] [place] [address] [phone]  Suzan Nailer, CNM, MSN 07/28/2022, 10:20 AM

## 2022-07-29 ENCOUNTER — Inpatient Hospital Stay (HOSPITAL_COMMUNITY): Admission: RE | Admit: 2022-07-29 | Payer: PRIVATE HEALTH INSURANCE | Source: Home / Self Care | Admitting: Obstetrics

## 2022-07-29 ENCOUNTER — Inpatient Hospital Stay (HOSPITAL_COMMUNITY): Payer: PRIVATE HEALTH INSURANCE

## 2022-07-29 LAB — SURGICAL PATHOLOGY

## 2022-08-03 ENCOUNTER — Inpatient Hospital Stay (HOSPITAL_COMMUNITY): Payer: PRIVATE HEALTH INSURANCE

## 2022-08-06 ENCOUNTER — Telehealth (HOSPITAL_COMMUNITY): Payer: Self-pay | Admitting: *Deleted

## 2022-08-06 NOTE — Telephone Encounter (Signed)
Attempted hospital discharge follow-up call. Left message for patient to return RN call with any questions or concerns. Erline Levine, RN, 08/06/22, [phone]
